# Patient Record
Sex: Female | Born: 2000 | Race: Black or African American | Hispanic: No | Marital: Single | Smoking: Never smoker
Health system: Southern US, Community
[De-identification: ages and names within clinical notes are randomized; demographics above are authoritative.]

## PROBLEM LIST (undated history)

## (undated) ENCOUNTER — Emergency Department (HOSPITAL_COMMUNITY): Admission: EM | Disposition: A | Discharge status: Home / Self Care | Payer: Self-pay

---
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
## 2018-03-05 ENCOUNTER — Encounter (HOSPITAL_COMMUNITY): Payer: Self-pay

## 2018-03-05 ENCOUNTER — Emergency Department (HOSPITAL_COMMUNITY)
Admission: EM | Admit: 2018-03-05 | Discharge: 2018-03-05 | Disposition: A | Discharge status: Home / Self Care | Payer: Self-pay | Attending: Emergency Medicine | Admitting: Emergency Medicine

## 2018-03-05 ENCOUNTER — Other Ambulatory Visit: Payer: Self-pay

## 2018-03-05 ENCOUNTER — Emergency Department (HOSPITAL_COMMUNITY): Payer: Self-pay

## 2018-03-05 DIAGNOSIS — R519 Headache, unspecified: Secondary | ICD-10-CM

## 2018-03-05 DIAGNOSIS — R51 Headache: Secondary | ICD-10-CM | POA: Insufficient documentation

## 2018-03-05 DIAGNOSIS — Z79899 Other long term (current) drug therapy: Secondary | ICD-10-CM | POA: Insufficient documentation

## 2018-03-05 MED ORDER — IBUPROFEN 400 MG PO TABS
400.0000 mg | ORAL_TABLET | Freq: Once | ORAL | Status: AC
  Administered 2018-03-05: 400 mg via ORAL
  Filled 2018-03-05: qty 1

## 2018-03-05 MED ORDER — ACETAMINOPHEN 325 MG PO TABS
650.0000 mg | ORAL_TABLET | Freq: Once | ORAL | Status: AC
  Administered 2018-03-05: 650 mg via ORAL
  Filled 2018-03-05: qty 2

## 2018-03-05 NOTE — ED Triage Notes (Signed)
Pt got a headache during the night that resulted in lightheadedness and nausea. Vomit x1. Chest started hurting afterwards. Middle chest 6/10. Has not taken any meds for this.

## 2018-03-05 NOTE — Discharge Instructions (Addendum)
Your exam today is normal.  Follow-up with your primary doctor.  Return to the ED if you develop new or worsening symptoms.

## 2018-03-05 NOTE — ED Provider Notes (Signed)
[facility] EMERGENCY DEPARTMENT Provider Note   CSN: [id number] Arrival date & time: 03/05/18  0002     History   Chief Complaint Chief Complaint  Patient presents with  . Headache    HPI Marissa Hicks is a 17 y.o. female.  Patient with gradual onset headache onset around 11 PM associated with lightheadedness and one episode of nausea and vomiting.  Patient reports no history of migraine headaches or headaches in general.  She denies thunderclap onset.  No fevers.  No focal weakness, numbness, tingling.  No difficulty with speaking or swallowing.  Patient states she threw up once with a headache and since then she had some tightness in her chest and some shortness of breath.  This lasted for about an hour has since resolved.  She has no chest pain or shortness of breath currently.  She does have some epigastric pain.  She reports no medical conditions and does not take any medicines.  Denies any chance of pregnancy.  Denies any photophobia or phonophobia.  The history is provided by the patient.  Headache   Associated symptoms include shortness of breath, nausea and vomiting.    History reviewed. No pertinent past medical history.  Patient Active Problem List   Diagnosis Date Noted  . Activities involving running 03/02/2014  . Pain in joint, lower leg 03/02/2014  . Muscle weakness (generalized) 03/02/2014    History reviewed. No pertinent surgical history.   OB History    Gravida  0   Para  0   Term  0   Preterm  0   AB  0   Living  0     SAB  0   TAB  0   Ectopic  0   Multiple  0   Live Births  0            Home Medications    Prior to Admission medications   Medication Sig Start Date End Date Taking? Authorizing Provider  etonogestrel (NEXPLANON) 68 MG IMPL implant 1 each by Subdermal route once.    [provider]    Family History Family History  Problem Relation Age of Onset  . Diabetes Maternal Grandmother   .  Hypertension Maternal Grandmother   . Hypertension Maternal Grandfather     Social History Social History   Tobacco Use  . Smoking status: Never Smoker  . Smokeless tobacco: Never Used  Substance Use Topics  . Alcohol use: Yes    Comment: occ  . Drug use: No     Allergies   Patient has no known allergies.   Review of Systems Review of Systems  Constitutional: Negative for activity change and appetite change.  Eyes: Negative for photophobia and visual disturbance.  Respiratory: Positive for shortness of breath. Negative for cough, chest tightness and wheezing.   Cardiovascular: Positive for chest pain.  Gastrointestinal: Positive for abdominal pain, nausea and vomiting.  Genitourinary: Negative for dysuria, vaginal bleeding and vaginal discharge.  Musculoskeletal: Negative for arthralgias, back pain, myalgias and neck stiffness.  Neurological: Positive for headaches. Negative for dizziness, weakness, light-headedness and numbness.   all other systems are negative except as noted in the HPI and PMH.     Physical Exam Updated Vital Signs BP 128/68   Pulse 95   Temp 98.7 F (37.1 C) (Oral)   Resp 18   Ht 5\' 5"  (1.651 m)   Wt 56.7 kg   SpO2 100%   BMI 20.80 kg/m   Physical  Exam  Constitutional: She is oriented to person, place, and time. She appears well-developed and well-nourished. No distress.  Well appearing, smiling  HENT:  Head: Normocephalic and atraumatic.  Mouth/Throat: Oropharynx is clear and moist. No oropharyngeal exudate.  Eyes: Pupils are equal, round, and reactive to light. Conjunctivae and EOM are normal.  Neck: Normal range of motion. Neck supple.  No meningismus.  Cardiovascular: Normal rate, regular rhythm, normal heart sounds and intact distal pulses.  No murmur heard. Pulmonary/Chest: Effort normal and breath sounds normal. No respiratory distress.  Abdominal: Soft. There is tenderness. There is no rebound and no guarding.  Mild epigastric  tenderness  Musculoskeletal: Normal range of motion. She exhibits no edema or tenderness.  Neurological: She is alert and oriented to person, place, and time. No cranial nerve deficit. She exhibits normal muscle tone. Coordination normal.  CN 2-12 intact, no ataxia on finger to nose, no nystagmus, 5/5 strength throughout, no pronator drift, Romberg negative, normal gait.   Skin: Skin is warm.  Psychiatric: She has a normal mood and affect. Her behavior is normal.  Nursing note and vitals reviewed.    ED Treatments / Results  Labs (all labs ordered are listed, but only abnormal results are displayed) Labs Reviewed - No data to display  EKG EKG Interpretation  Date/Time:  Thursday March 05 2018 01:46:09 EDT Ventricular Rate:  82 PR Interval:    QRS Duration: 74 QT Interval:  364 QTC Calculation: 426 R Axis:   72 Text Interpretation:  Sinus rhythm Borderline T wave abnormalities No previous ECGs available Confirmed by Glynn Octave [phone]) on 03/05/2018 1:50:06 AM   Radiology Dg Chest 2 View  Result Date: 03/05/2018 CLINICAL DATA:  Chest pain and dyspnea EXAM: CHEST - 2 VIEW COMPARISON:  02/24/2007 chest radiograph. FINDINGS: Stable cardiomediastinal silhouette with normal heart size. No pneumothorax. No pleural effusion. Lungs appear clear, with no acute consolidative airspace disease and no pulmonary edema. IMPRESSION: No active cardiopulmonary disease. Electronically Signed   By: Delbert Phenix M.D.   On: 03/05/2018 01:46    Procedures Procedures (including critical care time)  Medications Ordered in ED Medications  ibuprofen (ADVIL,MOTRIN) tablet 400 mg (has no administration in time range)  acetaminophen (TYLENOL) tablet 650 mg (has no administration in time range)     Initial Impression / Assessment and Plan / ED Course  I have reviewed the triage vital signs and the nursing notes.  Pertinent labs & imaging results that were available during my care of the  patient were reviewed by me and considered in my medical decision making (see chart for details).    Gradual onset headache with one episode of nausea and vomiting.  Episode of chest pain or shortness of breath which is since resolved.  Nonfocal neurological exam.  Low suspicion for subarachnoid hemorrhage, meningitis, temporal arteritis. Patient declines IV or IM medications.  Patient reports resolution of headache with Tylenol and ibuprofen.  Chest x-ray is negative.  EKG is reassuring.  Low suspicion for ACS, pulmonary embolism, or dissection.  Patient appears very well.  Her headache is not thunderclap onset and her neurological exam is normal.  Low suspicion for subarachnoid hemorrhage or meningitis. No indication for emergent CT scan.   She appears stable for follow-up with her PCP and/or neurology.  Return precautions discussed. Final Clinical Impressions(s) / ED Diagnoses   Final diagnoses:  Headache, unspecified headache type    ED Discharge Orders    None       Errin Chewning, Jeannett Senior,  MD 03/05/18 4098

## 2018-08-27 ENCOUNTER — Other Ambulatory Visit (HOSPITAL_COMMUNITY): Payer: Self-pay | Admitting: Nurse Practitioner

## 2018-08-27 DIAGNOSIS — R229 Localized swelling, mass and lump, unspecified: Principal | ICD-10-CM

## 2018-08-27 DIAGNOSIS — IMO0002 Reserved for concepts with insufficient information to code with codable children: Secondary | ICD-10-CM

## 2018-09-15 ENCOUNTER — Ambulatory Visit (HOSPITAL_COMMUNITY)
Admission: RE | Admit: 2018-09-15 | Discharge: 2018-09-15 | Disposition: A | Discharge status: Home / Self Care | Payer: Self-pay | Source: Ambulatory Visit | Attending: Nurse Practitioner | Admitting: Nurse Practitioner

## 2018-09-15 ENCOUNTER — Other Ambulatory Visit: Payer: Self-pay

## 2018-09-15 DIAGNOSIS — R229 Localized swelling, mass and lump, unspecified: Principal | ICD-10-CM

## 2018-09-15 DIAGNOSIS — IMO0002 Reserved for concepts with insufficient information to code with codable children: Secondary | ICD-10-CM

## 2019-01-07 ENCOUNTER — Other Ambulatory Visit (HOSPITAL_COMMUNITY): Payer: Self-pay | Admitting: Nurse Practitioner

## 2019-01-07 DIAGNOSIS — N63 Unspecified lump in unspecified breast: Secondary | ICD-10-CM

## 2019-01-12 ENCOUNTER — Ambulatory Visit (HOSPITAL_COMMUNITY)
Admission: RE | Admit: 2019-01-12 | Discharge: 2019-01-12 | Disposition: A | Discharge status: Home / Self Care | Payer: Self-pay | Source: Ambulatory Visit | Attending: Nurse Practitioner | Admitting: Nurse Practitioner

## 2019-01-12 ENCOUNTER — Other Ambulatory Visit: Payer: Self-pay

## 2019-01-12 DIAGNOSIS — N63 Unspecified lump in unspecified breast: Secondary | ICD-10-CM | POA: Insufficient documentation

## 2019-03-12 ENCOUNTER — Other Ambulatory Visit: Payer: Self-pay

## 2019-03-12 DIAGNOSIS — Z20822 Contact with and (suspected) exposure to covid-19: Secondary | ICD-10-CM

## 2019-03-13 LAB — NOVEL CORONAVIRUS, NAA: SARS-CoV-2, NAA: NOT DETECTED

## 2019-06-24 ENCOUNTER — Other Ambulatory Visit: Payer: PRIVATE HEALTH INSURANCE

## 2019-07-07 ENCOUNTER — Encounter: Payer: Self-pay | Admitting: Family Medicine

## 2019-07-08 ENCOUNTER — Encounter: Payer: Self-pay | Admitting: Family Medicine

## 2019-12-09 ENCOUNTER — Other Ambulatory Visit: Payer: Self-pay

## 2019-12-09 ENCOUNTER — Telehealth: Payer: Self-pay

## 2019-12-09 DIAGNOSIS — O3680X0 Pregnancy with inconclusive fetal viability, not applicable or unspecified: Secondary | ICD-10-CM

## 2019-12-09 NOTE — Progress Notes (Unsigned)
Called Radiology U/S to schedule less than 14 week U/S , states that the number is not in service. Was told system down, will have to keep trying to call back, not sure when it will be back up.

## 2019-12-09 NOTE — Telephone Encounter (Signed)
Marissa Hicks from [facility] called Nurse line on 12/08/19 after 3p, stating that this Pt Tested + for UPT. She had an Abdominal & Transvaginal US & they found no gestational sack. Per Marylynn Pearson RN, Pt. Will need a OB COMPLETE LESS THAN 14 WEEKS & Transvaginal.

## 2019-12-15 ENCOUNTER — Telehealth: Payer: Self-pay

## 2019-12-15 ENCOUNTER — Other Ambulatory Visit: Payer: Self-pay

## 2019-12-15 DIAGNOSIS — O3680X1 Pregnancy with inconclusive fetal viability, fetus 1: Secondary | ICD-10-CM

## 2019-12-15 NOTE — Telephone Encounter (Signed)
Called pt to advise that she has U/S at [facility] ON 12/20/19 @8AM , Please arrive @ 7:45am with a full bladder, no answer, had to leave a VM.

## 2019-12-20 ENCOUNTER — Ambulatory Visit: Admission: RE | Admit: 2019-12-20 | Payer: PRIVATE HEALTH INSURANCE | Source: Ambulatory Visit

## 2020-02-15 ENCOUNTER — Encounter (HOSPITAL_COMMUNITY): Payer: Self-pay

## 2020-02-15 ENCOUNTER — Emergency Department (HOSPITAL_COMMUNITY)
Admission: EM | Admit: 2020-02-15 | Discharge: 2020-02-15 | Disposition: A | Discharge status: Home / Self Care | Payer: PRIVATE HEALTH INSURANCE | Attending: Emergency Medicine | Admitting: Emergency Medicine

## 2020-02-15 ENCOUNTER — Other Ambulatory Visit: Payer: Self-pay

## 2020-02-15 DIAGNOSIS — R1030 Lower abdominal pain, unspecified: Secondary | ICD-10-CM | POA: Insufficient documentation

## 2020-02-15 DIAGNOSIS — R1010 Upper abdominal pain, unspecified: Secondary | ICD-10-CM

## 2020-02-15 DIAGNOSIS — R197 Diarrhea, unspecified: Secondary | ICD-10-CM | POA: Insufficient documentation

## 2020-02-15 DIAGNOSIS — Z5321 Procedure and treatment not carried out due to patient leaving prior to being seen by health care provider: Secondary | ICD-10-CM | POA: Insufficient documentation

## 2020-02-15 LAB — COMPREHENSIVE METABOLIC PANEL
ALT: 20 U/L (ref 0–44)
AST: 21 U/L (ref 15–41)
Albumin: 4.6 g/dL (ref 3.5–5.0)
Alkaline Phosphatase: 80 U/L (ref 38–126)
Anion gap: 10 (ref 5–15)
BUN: 10 mg/dL (ref 6–20)
CO2: 25 mmol/L (ref 22–32)
Calcium: 9.5 mg/dL (ref 8.9–10.3)
Chloride: 101 mmol/L (ref 98–111)
Creatinine, Ser: 0.69 mg/dL (ref 0.44–1.00)
GFR calc Af Amer: >60 mL/min (ref >60)
GFR calc non Af Amer: >60 mL/min (ref >60)
Glucose, Bld: 125 mg/dL — ABNORMAL HIGH (ref 70–99)
Potassium: 3.4 mmol/L — ABNORMAL LOW (ref 3.5–5.1)
Sodium: 136 mmol/L (ref 135–145)
Total Bilirubin: 1 mg/dL (ref 0.3–1.2)
Total Protein: 8.2 g/dL — ABNORMAL HIGH (ref 6.5–8.1)

## 2020-02-15 LAB — LIPASE, BLOOD: Lipase: 25 U/L (ref 11–51)

## 2020-02-15 LAB — URINALYSIS, ROUTINE W REFLEX MICROSCOPIC
Bilirubin Urine: NEGATIVE
Glucose, UA: NEGATIVE mg/dL
Hgb urine dipstick: NEGATIVE
Ketones, ur: NEGATIVE mg/dL
Leukocytes,Ua: NEGATIVE
Nitrite: NEGATIVE
Protein, ur: NEGATIVE mg/dL
Specific Gravity, Urine: 1.009 (ref 1.005–1.030)
pH: 7 (ref 5.0–8.0)

## 2020-02-15 LAB — CBC
HCT: 42 % (ref 36.0–46.0)
Hemoglobin: 13.2 g/dL (ref 12.0–15.0)
MCH: 27.1 pg (ref 26.0–34.0)
MCHC: 31.4 g/dL (ref 30.0–36.0)
MCV: 86.2 fL (ref 80.0–100.0)
Platelets: 377 10*3/uL (ref 150–400)
RBC: 4.87 MIL/uL (ref 3.87–5.11)
RDW: 14.6 % (ref 11.5–15.5)
WBC: 7.3 10*3/uL (ref 4.0–10.5)
nRBC: 0 % (ref 0.0–0.2)

## 2020-02-15 LAB — POC URINE PREG, ED: Preg Test, Ur: NEGATIVE

## 2020-02-15 NOTE — ED Triage Notes (Signed)
Pt presents to ED with complaints of lower abdominal cramping and diarrhea started last night.

## 2020-07-19 ENCOUNTER — Ambulatory Visit
Admission: EM | Admit: 2020-07-19 | Discharge: 2020-07-19 | Disposition: A | Discharge status: Home / Self Care | Payer: PRIVATE HEALTH INSURANCE | Attending: Family Medicine | Admitting: Family Medicine

## 2020-07-19 DIAGNOSIS — Z3202 Encounter for pregnancy test, result negative: Secondary | ICD-10-CM | POA: Diagnosis not present

## 2020-07-19 DIAGNOSIS — Z113 Encounter for screening for infections with a predominantly sexual mode of transmission: Secondary | ICD-10-CM | POA: Insufficient documentation

## 2020-07-19 DIAGNOSIS — N898 Other specified noninflammatory disorders of vagina: Secondary | ICD-10-CM | POA: Insufficient documentation

## 2020-07-19 LAB — POCT URINE PREGNANCY: Preg Test, Ur: NEGATIVE

## 2020-07-19 NOTE — ED Triage Notes (Signed)
Pt wants std screening after recent sexual encounter where she had some bloody discharge, no other symptoms

## 2020-07-19 NOTE — Discharge Instructions (Signed)
Blood work will be back overnight tonight   Your vaginal tests are pending.  If your test results are positive, we will call you.  You and your sexual partner(s) may require treatment at that time.  Do not have sexual activity until the test results are back.  Follow up with this office or with primary care if symptoms are persisting.  Follow up in the ER for high fever, trouble swallowing, trouble breathing, other concerning symptoms.

## 2020-07-19 NOTE — ED Provider Notes (Signed)
[facility] CARE CENTER   [id number] 07/19/20 Arrival Time: 1012   CC: CONCERN FOR STD  SUBJECTIVE:  Marissa Hicks is a 20 y.o. female who presents requesting STI screening.  Currently asymptomatic. Partner asymptomatic. Last unprotected sexual encounter was yesterday. Reports minimal bloody discharge after sex. Denies similar symptoms in the past.    Denies fever, chills, nausea, vomiting, abdominal or pelvic pain, urinary symptoms, vaginal itching, vaginal odor, vaginal bleeding, dyspareunia, vaginal rashes or lesions.   ROS: As per HPI.  All other pertinent ROS negative.     No past medical history on file. No past surgical history on file. No Known Allergies No current facility-administered medications on file prior to encounter.   Current Outpatient Medications on File Prior to Encounter  Medication Sig Dispense Refill  . etonogestrel (NEXPLANON) 68 MG IMPL implant 1 each by Subdermal route once.     Social History   Socioeconomic History  . Marital status: Single    Spouse name: Not on file  . Number of children: Not on file  . Years of education: Not on file  . Highest education level: Not on file  Occupational History  . Not on file  Tobacco Use  . Smoking status: Never Smoker  . Smokeless tobacco: Never Used  Substance and Sexual Activity  . Alcohol use: Yes    Comment: occ  . Drug use: No  . Sexual activity: Yes    Birth control/protection: Implant  Other Topics Concern  . Not on file  Social History Narrative  . Not on file   Social Determinants of Health   Financial Resource Strain: Not on file  Food Insecurity: Not on file  Transportation Needs: Not on file  Physical Activity: Not on file  Stress: Not on file  Social Connections: Not on file  Intimate Partner Violence: Not on file   Family History  Problem Relation Age of Onset  . Diabetes Maternal Grandmother   . Hypertension Maternal Grandmother   . Hypertension Maternal Grandfather      OBJECTIVE:  Vitals:   07/19/20 1034  BP: 111/71  Pulse: 90  Resp: 16  Temp: 97.9 F (36.6 C)  TempSrc: Tympanic  SpO2: 99%     General appearance: alert, NAD, appears stated age Head: NCAT Throat: lips, mucosa, and tongue normal; teeth and gums normal Lungs: CTA bilaterally without adventitious breath sounds Heart: regular rate and rhythm.  Radial pulses 2+ symmetrical bilaterally Back: no CVA tenderness Abdomen: soft, non-tender; bowel sounds normal; no masses or organomegaly; no guarding or rebound tenderness GU: deferred Skin: warm and dry Psychological:  Alert and cooperative. Normal mood and affect.  LABS:  Results for orders placed or performed during the hospital encounter of 07/19/20  HIV Antibody (routine testing w rflx)  Result Value Ref Range   HIV Screen 4th Generation wRfx Non Reactive Non Reactive  RPR  Result Value Ref Range   RPR Ser Ql Non Reactive Non Reactive  POCT urine pregnancy  Result Value Ref Range   Preg Test, Ur Negative Negative  Cervicovaginal ancillary only  Result Value Ref Range   Bacterial Vaginitis (gardnerella) Negative    Candida Vaginitis Negative    Candida Glabrata Negative    Trichomonas Negative    Chlamydia Negative    Neisseria Gonorrhea Negative    Comment      Normal Reference Range Bacterial Vaginosis - Negative   Comment Normal Reference Range Candida Species - Negative    Comment Normal Reference Range Candida  Galbrata - Negative    Comment Normal Reference Range Trichomonas - Negative    Comment Normal Reference Ranger Chlamydia - Negative    Comment      Normal Reference Range Neisseria Gonorrhea - Negative    Labs Reviewed  HIV ANTIBODY (ROUTINE TESTING W REFLEX)   Narrative:    Performed at:  [address] [facility] [address] [place], [place]  [id number] Lab Director: Jolene Schimke MD, Phone:  [phone]  RPR   Narrative:    Performed at:  [address] [place] [address], [place],  [place]  [id number] Lab Director: Jolene Schimke MD, Phone:  [phone]  POCT URINE PREGNANCY  CERVICOVAGINAL ANCILLARY ONLY    ASSESSMENT & PLAN:  1. Screen for STD (sexually transmitted disease)   2. Vaginal discharge   3. Negative pregnancy test     No orders of the defined types were placed in this encounter.   Pending: Labs Reviewed  HIV ANTIBODY (ROUTINE TESTING W REFLEX)   Narrative:    Performed at:  [facility] [address] [facility] [address] [place], [place]  [id number] Lab Director: Jolene Schimke MD, Phone:  [phone]  RPR   Narrative:    Performed at:  [address] [place] [address], [place], [place]  [id number] Lab Director: Jolene Schimke MD, Phone:  [phone]  POCT URINE PREGNANCY  CERVICOVAGINAL ANCILLARY ONLY   Negative UA in office today Negative pregnancy test in office today Vaginal self swab or Urine cytology obtained  HIV/ syphilis testing today Take medications as prescribed and to completion We will follow up with you regarding the results of your test If tests are positive, please abstain from sexual activity until you and your partner(s) are treated Follow up with PCP or [facility] if symptoms persists Return here or go to ER if you have any new or worsening symptoms    Reviewed expectations re: course of current medical issues. Questions answered. Outlined signs and symptoms indicating need for more acute intervention. Patient verbalized understanding. After Visit Summary given.       Moshe Cipro, NP 07/20/20 1951

## 2020-07-20 LAB — CERVICOVAGINAL ANCILLARY ONLY
Bacterial Vaginitis (gardnerella): NEGATIVE
Candida Glabrata: NEGATIVE
Candida Vaginitis: NEGATIVE
Chlamydia: NEGATIVE
Comment: NEGATIVE
Comment: NEGATIVE
Comment: NEGATIVE
Comment: NEGATIVE
Comment: NEGATIVE
Comment: NORMAL
Neisseria Gonorrhea: NEGATIVE
Trichomonas: NEGATIVE

## 2020-07-20 LAB — RPR: RPR Ser Ql: NONREACTIVE

## 2020-07-20 LAB — HIV ANTIBODY (ROUTINE TESTING W REFLEX): HIV Screen 4th Generation wRfx: NONREACTIVE

## 2020-11-13 ENCOUNTER — Other Ambulatory Visit: Payer: Self-pay

## 2020-11-13 ENCOUNTER — Encounter (HOSPITAL_COMMUNITY): Payer: Self-pay

## 2020-11-13 ENCOUNTER — Inpatient Hospital Stay (EMERGENCY_DEPARTMENT_HOSPITAL)
Admission: AD | Admit: 2020-11-13 | Discharge: 2020-11-13 | Disposition: A | Discharge status: Home / Self Care | Payer: PRIVATE HEALTH INSURANCE | Source: Home / Self Care | Attending: Family Medicine | Admitting: Family Medicine

## 2020-11-13 ENCOUNTER — Emergency Department (HOSPITAL_COMMUNITY)
Admission: EM | Admit: 2020-11-13 | Discharge: 2020-11-13 | Disposition: A | Discharge status: Home / Self Care | Payer: PRIVATE HEALTH INSURANCE | Attending: Emergency Medicine | Admitting: Emergency Medicine

## 2020-11-13 DIAGNOSIS — N912 Amenorrhea, unspecified: Secondary | ICD-10-CM | POA: Insufficient documentation

## 2020-11-13 DIAGNOSIS — R102 Pelvic and perineal pain: Secondary | ICD-10-CM | POA: Diagnosis present

## 2020-11-13 DIAGNOSIS — Z5321 Procedure and treatment not carried out due to patient leaving prior to being seen by health care provider: Secondary | ICD-10-CM | POA: Insufficient documentation

## 2020-11-13 LAB — HCG, QUANTITATIVE, PREGNANCY: hCG, Beta Chain, Quant, S: <1 m[IU]/mL (ref <5)

## 2020-11-13 NOTE — ED Triage Notes (Signed)
Pt pov from home with cc of missed period. Last cycle was april 24th. Has been having lower pelvic pain. Has had negative urine tests but was a blood test to confirm.  Has had intercourse. Has not been on [facility] in 2 years.

## 2020-11-13 NOTE — Discharge Instructions (Signed)
[facility] Prenatal Care Providers          [facility] locations:  Hours may vary. Please call for an appointment  [facility] @ [facility]  [address] [phone]  [facility] @ [facility]   [address]  [phone]  [facility] @ [place]       [address] [phone]            [facility] @ [place]     [address] [phone]          [facility] @ [place]   [address] [phone]  [facility] @ [facility]  [address] [phone]     [facility] @ [facility]  [address]   [phone]  

## 2020-11-13 NOTE — MAU Provider Note (Signed)
S Ms. HATTYE SIEGFRIED is a 20 y.o. G0P0000 female who presents to MAU today with complaint of amenorrhea and negative home UPT. Denies pain or VB.   ROS: No pain No VB  O BP 136/76 (BP Location: Right Arm)   Pulse (!) 106   Temp 99.2 F (37.3 C) (Oral)   Resp 20   Ht 5\' 5"  (1.651 m)   Wt 72.5 kg   LMP 10/01/2020   BMI 26.59 kg/m  Physical Exam Vitals and nursing note reviewed.  Constitutional:      Appearance: Normal appearance.  HENT:     Head: Normocephalic and atraumatic.  Pulmonary:     Effort: Pulmonary effort is normal. No respiratory distress.  Musculoskeletal:     Cervical back: Normal range of motion.  Neurological:     General: No focal deficit present.     Mental Status: She is alert and oriented to person, place, and time.  Psychiatric:        Mood and Affect: Mood normal.        Behavior: Behavior normal.    MDM: No indication for UPT in MAU but UPT done by tech and negative per RN, pt informed. Stable for discharge home.   A 1. Amenorrhea    P Discharge from MAU in stable condition Warning signs for worsening condition that would warrant emergency follow-up discussed Patient may return to MAU as needed for pregnancy related complaints List of [facility] offices provided  [facility], Donette Larry 11/13/2020 7:48 PM

## 2020-11-13 NOTE — MAU Note (Signed)
PT SAYS NO CYCLE IN MAY. PT SAYS UPT IS NEG AT HOME - LAST Friday NO PAIN, NO BLEEDING.

## 2021-01-09 ENCOUNTER — Other Ambulatory Visit: Payer: Self-pay

## 2021-01-09 ENCOUNTER — Encounter (HOSPITAL_COMMUNITY): Payer: Self-pay | Admitting: *Deleted

## 2021-01-09 DIAGNOSIS — Z5321 Procedure and treatment not carried out due to patient leaving prior to being seen by health care provider: Secondary | ICD-10-CM | POA: Insufficient documentation

## 2021-01-09 DIAGNOSIS — N939 Abnormal uterine and vaginal bleeding, unspecified: Secondary | ICD-10-CM | POA: Insufficient documentation

## 2021-01-09 LAB — CBC WITH DIFFERENTIAL/PLATELET
Abs Immature Granulocytes: 0.02 10*3/uL (ref 0.00–0.07)
Basophils Absolute: 0.1 10*3/uL (ref 0.0–0.1)
Basophils Relative: 1 %
Eosinophils Absolute: 0.4 10*3/uL (ref 0.0–0.5)
Eosinophils Relative: 6 %
HCT: 37.7 % (ref 36.0–46.0)
Hemoglobin: 11.9 g/dL — ABNORMAL LOW (ref 12.0–15.0)
Immature Granulocytes: 0 %
Lymphocytes Relative: 29 %
Lymphs Abs: 1.9 10*3/uL (ref 0.7–4.0)
MCH: 27.3 pg (ref 26.0–34.0)
MCHC: 31.6 g/dL (ref 30.0–36.0)
MCV: 86.5 fL (ref 80.0–100.0)
Monocytes Absolute: 0.7 10*3/uL (ref 0.1–1.0)
Monocytes Relative: 10 %
Neutro Abs: 3.4 10*3/uL (ref 1.7–7.7)
Neutrophils Relative %: 54 %
Platelets: 361 10*3/uL (ref 150–400)
RBC: 4.36 MIL/uL (ref 3.87–5.11)
RDW: 15.2 % (ref 11.5–15.5)
WBC: 6.5 10*3/uL (ref 4.0–10.5)
nRBC: 0 % (ref 0.0–0.2)

## 2021-01-09 LAB — BASIC METABOLIC PANEL
Anion gap: 4 — ABNORMAL LOW (ref 5–15)
BUN: 11 mg/dL (ref 6–20)
CO2: 26 mmol/L (ref 22–32)
Calcium: 8.6 mg/dL — ABNORMAL LOW (ref 8.9–10.3)
Chloride: 106 mmol/L (ref 98–111)
Creatinine, Ser: 0.7 mg/dL (ref 0.44–1.00)
GFR, Estimated: >60 mL/min (ref >60)
Glucose, Bld: 84 mg/dL (ref 70–99)
Potassium: 3.3 mmol/L — ABNORMAL LOW (ref 3.5–5.1)
Sodium: 136 mmol/L (ref 135–145)

## 2021-01-09 LAB — HCG, QUANTITATIVE, PREGNANCY: hCG, Beta Chain, Quant, S: <1 m[IU]/mL (ref <5)

## 2021-01-09 NOTE — ED Triage Notes (Signed)
Vaginal bleeding for 2 days, states it is her second in a month. States bleeding is heavy

## 2021-01-10 ENCOUNTER — Emergency Department (HOSPITAL_COMMUNITY)
Admission: EM | Admit: 2021-01-10 | Discharge: 2021-01-10 | Disposition: A | Discharge status: Home / Self Care | Payer: PRIVATE HEALTH INSURANCE | Attending: Physician Assistant | Admitting: Physician Assistant

## 2021-02-09 ENCOUNTER — Other Ambulatory Visit: Payer: Self-pay

## 2021-02-09 ENCOUNTER — Inpatient Hospital Stay (HOSPITAL_COMMUNITY)
Admission: AD | Admit: 2021-02-09 | Discharge: 2021-02-09 | Disposition: A | Discharge status: Home / Self Care | Payer: PRIVATE HEALTH INSURANCE | Attending: Obstetrics & Gynecology | Admitting: Obstetrics & Gynecology

## 2021-02-09 DIAGNOSIS — Z3202 Encounter for pregnancy test, result negative: Secondary | ICD-10-CM

## 2021-02-09 DIAGNOSIS — N941 Unspecified dyspareunia: Secondary | ICD-10-CM

## 2021-02-09 LAB — POCT PREGNANCY, URINE: Preg Test, Ur: NEGATIVE

## 2021-02-09 NOTE — MAU Note (Signed)
Marissa Hicks is a 20 y.o. here in MAU reporting: lower abdominal pain since yesterday, states then she was having jabbing pain in her lower abdomen while having intercourse. No bleeding or discharge. Was unsure if pregnant.  LMP: unknown, states they are irregular  Onset of complaint: yesterday  Pain score: 7/10  Vitals:   02/09/21 1309  BP: 135/73  Pulse: 77  Resp: 16  Temp: 98.1 F (36.7 C)  SpO2: 98%     Lab orders placed from triage: upt

## 2021-02-09 NOTE — MAU Provider Note (Signed)
Event Date/Time   First Provider Initiated Contact with Patient 02/09/21 1335      S Ms. Marissa Hicks is a 20 y.o. G0P0000 patient who presents to MAU today with complaint of abdominal pain. She is not pregnant.   O BP 135/73 (BP Location: Right Arm)   Pulse 77   Temp 98.1 F (36.7 C) (Oral)   Resp 16   Ht 5\' 5"  (1.651 m)   Wt 75.3 kg   LMP  (LMP Unknown)   SpO2 98% Comment: room air  BMI 27.61 kg/m  Physical Exam Constitutional:      General: She is not in acute distress.    Appearance: She is well-developed. She is not ill-appearing.  Neurological:     Mental Status: She is alert and oriented to person, place, and time.    A Medical screening exam complete None pregnant female   P Discharge from MAU in stable condition Patient given the option of transfer to [facility] for further evaluation or seek care in outpatient facility of choice; patient would like to f/u in our GYN office. Information given.  List of options for follow-up given  Warning signs for worsening condition that would warrant emergency follow-up discussed Patient may return to MAU as needed   Jaidan Prevette, [facility], NP 02/09/2021 1:35 PM

## 2021-02-16 ENCOUNTER — Encounter (HOSPITAL_COMMUNITY): Payer: Self-pay | Admitting: Emergency Medicine

## 2021-02-16 ENCOUNTER — Emergency Department (HOSPITAL_COMMUNITY): Payer: Self-pay

## 2021-02-16 ENCOUNTER — Other Ambulatory Visit: Payer: Self-pay

## 2021-02-16 ENCOUNTER — Emergency Department (HOSPITAL_COMMUNITY)
Admission: EM | Admit: 2021-02-16 | Discharge: 2021-02-17 | Disposition: A | Discharge status: Home / Self Care | Payer: Self-pay | Attending: Physician Assistant | Admitting: Physician Assistant

## 2021-02-16 DIAGNOSIS — Z5321 Procedure and treatment not carried out due to patient leaving prior to being seen by health care provider: Secondary | ICD-10-CM | POA: Insufficient documentation

## 2021-02-16 DIAGNOSIS — S01111A Laceration without foreign body of right eyelid and periocular area, initial encounter: Secondary | ICD-10-CM | POA: Insufficient documentation

## 2021-02-16 DIAGNOSIS — Y9289 Other specified places as the place of occurrence of the external cause: Secondary | ICD-10-CM | POA: Insufficient documentation

## 2021-02-16 DIAGNOSIS — M79631 Pain in right forearm: Secondary | ICD-10-CM | POA: Insufficient documentation

## 2021-02-16 DIAGNOSIS — Y908 Blood alcohol level of 240 mg/100 ml or more: Secondary | ICD-10-CM | POA: Insufficient documentation

## 2021-02-16 DIAGNOSIS — F10929 Alcohol use, unspecified with intoxication, unspecified: Secondary | ICD-10-CM | POA: Insufficient documentation

## 2021-02-16 DIAGNOSIS — R519 Headache, unspecified: Secondary | ICD-10-CM | POA: Insufficient documentation

## 2021-02-16 LAB — I-STAT CHEM 8, ED
BUN: 6 mg/dL (ref 6–20)
Calcium, Ion: 1.11 mmol/L — ABNORMAL LOW (ref 1.15–1.40)
Chloride: 114 mmol/L — ABNORMAL HIGH (ref 98–111)
Creatinine, Ser: 1.1 mg/dL — ABNORMAL HIGH (ref 0.44–1.00)
Glucose, Bld: 116 mg/dL — ABNORMAL HIGH (ref 70–99)
HCT: 44 % (ref 36.0–46.0)
Hemoglobin: 15 g/dL (ref 12.0–15.0)
Potassium: 3.3 mmol/L — ABNORMAL LOW (ref 3.5–5.1)
Sodium: 146 mmol/L — ABNORMAL HIGH (ref 135–145)
TCO2: 21 mmol/L — ABNORMAL LOW (ref 22–32)

## 2021-02-16 LAB — COMPREHENSIVE METABOLIC PANEL
ALT: 20 U/L (ref 0–44)
AST: 21 U/L (ref 15–41)
Albumin: 4.2 g/dL (ref 3.5–5.0)
Alkaline Phosphatase: 74 U/L (ref 38–126)
Anion gap: 10 (ref 5–15)
BUN: 7 mg/dL (ref 6–20)
CO2: 21 mmol/L — ABNORMAL LOW (ref 22–32)
Calcium: 8.9 mg/dL (ref 8.9–10.3)
Chloride: 112 mmol/L — ABNORMAL HIGH (ref 98–111)
Creatinine, Ser: 0.79 mg/dL (ref 0.44–1.00)
GFR, Estimated: >60 mL/min (ref >60)
Glucose, Bld: 108 mg/dL — ABNORMAL HIGH (ref 70–99)
Potassium: 3.2 mmol/L — ABNORMAL LOW (ref 3.5–5.1)
Sodium: 143 mmol/L (ref 135–145)
Total Bilirubin: 0.9 mg/dL (ref 0.3–1.2)
Total Protein: 7.5 g/dL (ref 6.5–8.1)

## 2021-02-16 LAB — CBC WITH DIFFERENTIAL/PLATELET
Abs Immature Granulocytes: 0.04 10*3/uL (ref 0.00–0.07)
Basophils Absolute: 0.1 10*3/uL (ref 0.0–0.1)
Basophils Relative: 1 %
Eosinophils Absolute: 0.3 10*3/uL (ref 0.0–0.5)
Eosinophils Relative: 5 %
HCT: 41.7 % (ref 36.0–46.0)
Hemoglobin: 13.6 g/dL (ref 12.0–15.0)
Immature Granulocytes: 1 %
Lymphocytes Relative: 30 %
Lymphs Abs: 1.8 10*3/uL (ref 0.7–4.0)
MCH: 27.6 pg (ref 26.0–34.0)
MCHC: 32.6 g/dL (ref 30.0–36.0)
MCV: 84.6 fL (ref 80.0–100.0)
Monocytes Absolute: 0.5 10*3/uL (ref 0.1–1.0)
Monocytes Relative: 9 %
Neutro Abs: 3.2 10*3/uL (ref 1.7–7.7)
Neutrophils Relative %: 54 %
Platelets: 371 10*3/uL (ref 150–400)
RBC: 4.93 MIL/uL (ref 3.87–5.11)
RDW: 15.1 % (ref 11.5–15.5)
WBC: 5.9 10*3/uL (ref 4.0–10.5)
nRBC: 0 % (ref 0.0–0.2)

## 2021-02-16 LAB — I-STAT BETA HCG BLOOD, ED (MC, WL, AP ONLY): I-stat hCG, quantitative: <5 m[IU]/mL (ref <5)

## 2021-02-16 LAB — ETHANOL: Alcohol, Ethyl (B): 308 mg/dL (ref <10)
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
## 2021-02-16 NOTE — ED Notes (Signed)
Pt removed IV that was placed by EMS
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
## 2021-02-17 NOTE — ED Notes (Signed)
Pt left AMA °

## 2021-06-07 ENCOUNTER — Encounter (HOSPITAL_COMMUNITY): Payer: Self-pay

## 2021-06-07 ENCOUNTER — Emergency Department (HOSPITAL_COMMUNITY)
Admission: EM | Admit: 2021-06-07 | Discharge: 2021-06-07 | Disposition: A | Discharge status: Home / Self Care | Payer: Self-pay | Attending: Emergency Medicine | Admitting: Emergency Medicine

## 2021-06-07 ENCOUNTER — Other Ambulatory Visit: Payer: Self-pay

## 2021-06-07 ENCOUNTER — Emergency Department (HOSPITAL_COMMUNITY): Payer: Self-pay

## 2021-06-07 DIAGNOSIS — O3481 Maternal care for other abnormalities of pelvic organs, first trimester: Secondary | ICD-10-CM | POA: Insufficient documentation

## 2021-06-07 DIAGNOSIS — Z3A01 Less than 8 weeks gestation of pregnancy: Secondary | ICD-10-CM | POA: Insufficient documentation

## 2021-06-07 DIAGNOSIS — N9489 Other specified conditions associated with female genital organs and menstrual cycle: Secondary | ICD-10-CM | POA: Insufficient documentation

## 2021-06-07 DIAGNOSIS — Z3490 Encounter for supervision of normal pregnancy, unspecified, unspecified trimester: Secondary | ICD-10-CM

## 2021-06-07 DIAGNOSIS — N83201 Unspecified ovarian cyst, right side: Secondary | ICD-10-CM | POA: Insufficient documentation

## 2021-06-07 LAB — COMPREHENSIVE METABOLIC PANEL
ALT: 24 U/L (ref 0–44)
AST: 24 U/L (ref 15–41)
Albumin: 4.6 g/dL (ref 3.5–5.0)
Alkaline Phosphatase: 76 U/L (ref 38–126)
Anion gap: 7 (ref 5–15)
BUN: 12 mg/dL (ref 6–20)
CO2: 26 mmol/L (ref 22–32)
Calcium: 9.1 mg/dL (ref 8.9–10.3)
Chloride: 101 mmol/L (ref 98–111)
Creatinine, Ser: 0.87 mg/dL (ref 0.44–1.00)
GFR, Estimated: >60 mL/min (ref >60)
Glucose, Bld: 93 mg/dL (ref 70–99)
Potassium: 3.2 mmol/L — ABNORMAL LOW (ref 3.5–5.1)
Sodium: 134 mmol/L — ABNORMAL LOW (ref 135–145)
Total Bilirubin: 1 mg/dL (ref 0.3–1.2)
Total Protein: 8.1 g/dL (ref 6.5–8.1)

## 2021-06-07 LAB — URINALYSIS, ROUTINE W REFLEX MICROSCOPIC
Bilirubin Urine: NEGATIVE
Glucose, UA: NEGATIVE mg/dL
Ketones, ur: NEGATIVE mg/dL
Leukocytes,Ua: NEGATIVE
Nitrite: NEGATIVE
Protein, ur: 30 mg/dL — AB
Specific Gravity, Urine: 1.028 (ref 1.005–1.030)
pH: 6 (ref 5.0–8.0)

## 2021-06-07 LAB — CBC
HCT: 42.3 % (ref 36.0–46.0)
Hemoglobin: 13.6 g/dL (ref 12.0–15.0)
MCH: 27.3 pg (ref 26.0–34.0)
MCHC: 32.2 g/dL (ref 30.0–36.0)
MCV: 84.8 fL (ref 80.0–100.0)
Platelets: 372 10*3/uL (ref 150–400)
RBC: 4.99 MIL/uL (ref 3.87–5.11)
RDW: 15.8 % — ABNORMAL HIGH (ref 11.5–15.5)
WBC: 9.9 10*3/uL (ref 4.0–10.5)
nRBC: 0 % (ref 0.0–0.2)

## 2021-06-07 LAB — HCG, QUANTITATIVE, PREGNANCY: hCG, Beta Chain, Quant, S: 5917 m[IU]/mL — ABNORMAL HIGH (ref <5)

## 2021-06-07 LAB — LIPASE, BLOOD: Lipase: 29 U/L (ref 11–51)

## 2021-06-07 LAB — POC URINE PREG, ED: Preg Test, Ur: POSITIVE — AB

## 2021-06-07 LAB — ABO/RH: ABO/RH(D): A POS

## 2021-06-07 MED ORDER — ONDANSETRON HCL 4 MG/2ML IJ SOLN
4.0000 mg | Freq: Once | INTRAMUSCULAR | Status: AC
  Administered 2021-06-07: 13:00:00 4 mg via INTRAVENOUS
  Filled 2021-06-07: qty 2

## 2021-06-07 MED ORDER — LACTATED RINGERS IV BOLUS
1000.0000 mL | Freq: Once | INTRAVENOUS | Status: AC
  Administered 2021-06-07: 12:00:00 1000 mL via INTRAVENOUS

## 2021-06-07 NOTE — ED Provider Notes (Signed)
[facility] EMERGENCY DEPARTMENT Provider Note  CSN: [id number] Arrival date & time: 06/07/21 1018    History Chief Complaint  Patient presents with   Abdominal Pain    Marissa Hicks is a 19 y.o. female with no significant history reports she has had 2 days of lower abdominal cramping, assocaited with nausea, vomiting, diarrhea. She reports some mild vaginal spotting but not like her normal menses and she is concerned she may be pregnant. No fevers no dysuria.    History reviewed. No pertinent past medical history.  History reviewed. No pertinent surgical history.  Family History  Problem Relation Age of Onset   Diabetes Maternal Grandmother    Hypertension Maternal Grandmother    Hypertension Maternal Grandfather     Social History   Tobacco Use   Smoking status: Never   Smokeless tobacco: Never  Substance Use Topics   Alcohol use: Yes    Comment: occ   Drug use: No     Home Medications Prior to Admission medications   Not on File     Allergies    Patient has no known allergies.   Review of Systems   Review of Systems A comprehensive review of systems was completed and negative except as noted in HPI.    Physical Exam BP (!) 112/97    Pulse 95    Temp 99.5 F (37.5 C) (Oral)    Resp 16    Ht 5\' 5"  (1.651 m)    Wt 73.9 kg    LMP 04/23/2021 (Approximate)    SpO2 100%    BMI 27.12 kg/m   Physical Exam Vitals and nursing note reviewed.  Constitutional:      Appearance: Normal appearance.  HENT:     Head: Normocephalic and atraumatic.     Nose: Nose normal.     Mouth/Throat:     Mouth: Mucous membranes are moist.  Eyes:     Extraocular Movements: Extraocular movements intact.     Conjunctiva/sclera: Conjunctivae normal.  Cardiovascular:     Rate and Rhythm: Normal rate.  Pulmonary:     Effort: Pulmonary effort is normal.     Breath sounds: Normal breath sounds.  Abdominal:     General: Abdomen is flat.     Palpations: Abdomen is soft.      Tenderness: There is abdominal tenderness in the right lower quadrant and suprapubic area.     Comments: Exam is limited by patient sitting in a chair in triage, will re-examine when she is in an ED room  Musculoskeletal:        General: No swelling. Normal range of motion.     Cervical back: Neck supple.  Skin:    General: Skin is warm and dry.  Neurological:     General: No focal deficit present.     Mental Status: She is alert.  Psychiatric:        Mood and Affect: Mood normal.     ED Results / Procedures / Treatments   Labs (all labs ordered are listed, but only abnormal results are displayed) Labs Reviewed  COMPREHENSIVE METABOLIC PANEL - Abnormal; Notable for the following components:      Result Value   Sodium 134 (*)    Potassium 3.2 (*)    All other components within normal limits  CBC - Abnormal; Notable for the following components:   RDW 15.8 (*)    All other components within normal limits  URINALYSIS, ROUTINE W REFLEX MICROSCOPIC - Abnormal;  Notable for the following components:   APPearance HAZY (*)    Hgb urine dipstick SMALL (*)    Protein, ur 30 (*)    Bacteria, UA RARE (*)    All other components within normal limits  HCG, QUANTITATIVE, PREGNANCY - Abnormal; Notable for the following components:   hCG, Beta Chain, Quant, S 5,917 (*)    All other components within normal limits  POC URINE PREG, ED - Abnormal; Notable for the following components:   Preg Test, Ur Positive (*)    All other components within normal limits  LIPASE, BLOOD  ABO/RH    EKG None  Radiology US OB LESS THAN 14 WEEKS WITH OB TRANSVAGINAL  Result Date: 06/07/2021 CLINICAL DATA:  Pregnancy with pain for 2 days. EXAM: OBSTETRIC <14 WK [place] AND TRANSVAGINAL OB US TECHNIQUE: Both transabdominal and transvaginal ultrasound examinations were performed for complete evaluation of the gestation as well as the maternal uterus, adnexal regions, and pelvic cul-de-sac. Transvaginal  technique was performed to assess early pregnancy. COMPARISON:  None. FINDINGS: Intrauterine gestational sac: Single Yolk sac:  Not Visualized. Embryo:  Not Visualized. Cardiac Activity: Not Visualized. Heart Rate: Not applicable bpm MSD: 5.2 mm   5 w   2 d Subchorionic hemorrhage:  None visualized. Maternal uterus/adnexae: Right ovary: There is a large anechoic cyst containing a single internal area of septation measuring 6.4 x 6.0 x 7.1 cm. The septation appears thin measuring 3 mm in thickness. No solid mural nodule identified. No significant increased vascularity. Left ovary: Normal Other :None Free fluid:  Trace free fluid IMPRESSION: 1. Probable early intrauterine gestational sac, but no yolk sac, fetal pole, or cardiac activity yet visualized. Recommend follow-up quantitative B-HCG levels and follow-up US in 14 days to confirm and assess viability. This recommendation follows SRU consensus guidelines: Diagnostic Criteria for Nonviable Pregnancy Early in the First Trimester. Malva Limes Med 2013; 595:6387-56. 2. Large anechoic cystic mass arises within the right ovary containing a single thin internal area of septation. Recommend follow-up US in 3-6 months. Note: This recommendation does not apply to premenarchal patients or to those with increased risk (genetic, family history, elevated tumor markers or other high-risk factors) of ovarian cancer. Reference: Radiology 2019 Nov; 293(2):359-371. Electronically Signed   By: Signa Kell M.D.   On: 06/07/2021 12:40    Procedures Procedures  Medications Ordered in the ED Medications  ondansetron (ZOFRAN) injection 4 mg (4 mg Intravenous Given 06/07/21 1231)  lactated ringers bolus 1,000 mL (0 mLs Intravenous Stopped 06/07/21 1313)     MDM Rules/Calculators/A&P MDM Patient with lower abdominal cramping and possible missed period. Will check labs, including HCG and re-evaluate when she is roomed.   ED Course  I have reviewed the triage vital signs and  the nursing notes.  Pertinent labs & imaging results that were available during my care of the patient were reviewed by me and considered in my medical decision making (see chart for details).  Clinical Course as of 06/07/21 1424  Thu Jun 07, 2021  1111 Preg is positive. Will add quant and check [place].  [CS]  1118 Patient is now G2P0, prior pregnancy was an ectopic on right side treated with methotrexate. No other pregnancies. She is mildly tender on the right side, not guarding. Hemodynamically stable aside from mild tachycardia. Will give IVF while awaiting [place].  [CS]  1201 UA neg for infection.  [CS]  1253 CBC is normal.  [CS]  1318 CMP, lipase are normal.  [CS]  1349 US shows a gestational sac without fetal pole, but consistent with her LMP. Quant is appropriately elevated. There is a cyst on her R ovary which may explain her right sided pelvic pain. Awaiting ABO/RH due to reports of spotting. If Rh+, will plan discharge with close outpatient Ob follow up for further pregnancy care.  [CS]  1417 Reviewed [place] results with patient including cyst. Recommend close Ob follow up. Referred to [facility].  [CS]    Clinical Course User Index [CS] Truddie Hidden, MD    Final Clinical Impression(s) / ED Diagnoses Final diagnoses:  Pregnancy  Cyst of right ovary    Rx / DC Orders ED Discharge Orders     None        Truddie Hidden, MD 06/07/21 1424

## 2021-06-07 NOTE — ED Triage Notes (Signed)
Patient with complaints of abdominal pain for 2 days with nausea vomiting and diarrhea that started today.

## 2021-06-23 ENCOUNTER — Emergency Department (HOSPITAL_COMMUNITY)
Admission: EM | Admit: 2021-06-23 | Discharge: 2021-06-23 | Disposition: A | Discharge status: Home / Self Care | Payer: Self-pay | Attending: Emergency Medicine | Admitting: Emergency Medicine

## 2021-06-23 ENCOUNTER — Encounter (HOSPITAL_COMMUNITY): Payer: Self-pay

## 2021-06-23 ENCOUNTER — Other Ambulatory Visit: Payer: Self-pay

## 2021-06-23 DIAGNOSIS — R109 Unspecified abdominal pain: Secondary | ICD-10-CM

## 2021-06-23 DIAGNOSIS — R3915 Urgency of urination: Secondary | ICD-10-CM | POA: Insufficient documentation

## 2021-06-23 DIAGNOSIS — O200 Threatened abortion: Secondary | ICD-10-CM | POA: Insufficient documentation

## 2021-06-23 DIAGNOSIS — R112 Nausea with vomiting, unspecified: Secondary | ICD-10-CM | POA: Insufficient documentation

## 2021-06-23 LAB — URINALYSIS, ROUTINE W REFLEX MICROSCOPIC
Bilirubin Urine: NEGATIVE
Glucose, UA: NEGATIVE mg/dL
Hgb urine dipstick: NEGATIVE
Ketones, ur: NEGATIVE mg/dL
Leukocytes,Ua: NEGATIVE
Nitrite: NEGATIVE
Protein, ur: NEGATIVE mg/dL
Specific Gravity, Urine: 1.02 (ref 1.005–1.030)
pH: 7.5 (ref 5.0–8.0)

## 2021-06-23 LAB — CBC
HCT: 41.1 % (ref 36.0–46.0)
Hemoglobin: 13.2 g/dL (ref 12.0–15.0)
MCH: 27.7 pg (ref 26.0–34.0)
MCHC: 32.1 g/dL (ref 30.0–36.0)
MCV: 86.2 fL (ref 80.0–100.0)
Platelets: 341 10*3/uL (ref 150–400)
RBC: 4.77 MIL/uL (ref 3.87–5.11)
RDW: 14.7 % (ref 11.5–15.5)
WBC: 9 10*3/uL (ref 4.0–10.5)
nRBC: 0 % (ref 0.0–0.2)

## 2021-06-23 LAB — BASIC METABOLIC PANEL
Anion gap: 6 (ref 5–15)
BUN: 10 mg/dL (ref 6–20)
CO2: 23 mmol/L (ref 22–32)
Calcium: 8.6 mg/dL — ABNORMAL LOW (ref 8.9–10.3)
Chloride: 106 mmol/L (ref 98–111)
Creatinine, Ser: 0.79 mg/dL (ref 0.44–1.00)
GFR, Estimated: >60 mL/min (ref >60)
Glucose, Bld: 90 mg/dL (ref 70–99)
Potassium: 3.3 mmol/L — ABNORMAL LOW (ref 3.5–5.1)
Sodium: 135 mmol/L (ref 135–145)

## 2021-06-23 LAB — HCG, QUANTITATIVE, PREGNANCY: hCG, Beta Chain, Quant, S: 142401 m[IU]/mL — ABNORMAL HIGH (ref <5)

## 2021-06-23 MED ORDER — ACETAMINOPHEN 325 MG PO TABS
650.0000 mg | ORAL_TABLET | Freq: Once | ORAL | Status: AC
  Administered 2021-06-23: 650 mg via ORAL
  Filled 2021-06-23: qty 2

## 2021-06-23 MED ORDER — METOCLOPRAMIDE HCL 5 MG/ML IJ SOLN
10.0000 mg | Freq: Once | INTRAMUSCULAR | Status: AC
Start: 2021-06-23 — End: 2021-06-23
  Administered 2021-06-23: 10 mg via INTRAVENOUS
  Filled 2021-06-23: qty 2

## 2021-06-23 MED ORDER — METOCLOPRAMIDE HCL 10 MG PO TABS: 10.0000 mg | ORAL_TABLET | Freq: Four times a day (QID) | ORAL | 0 refills | Status: DC

## 2021-06-23 MED ORDER — SODIUM CHLORIDE 0.9 % IV BOLUS
1000.0000 mL | Freq: Once | INTRAVENOUS | Status: AC
Start: 2021-06-23 — End: 2021-06-23
  Administered 2021-06-23: 1000 mL via INTRAVENOUS

## 2021-06-23 NOTE — Discharge Instructions (Addendum)
Your testing today showed a hCG level of 142,000, this is up from 5002 weeks ago.  We have done an ultrasound at the bedside, this is not a formal ultrasound but it shows that your uterus has a very small gestational sac, it is not clear whether we can actually see the baby or not, please share this with your OB/GYN and have them evaluate you in the office for the same things.  If you continue to bleed this may be in relation to having a miscarriage, there is nothing that you can do about it but I do want you to come back if you have heavy bleeding or severe pain  Tylenol is safe in pregnancy, 650 mg every 6 hours as needed  Reglan 10 mg every 6 hours as needed for nausea or vomiting, no medication is totally safe in pregnancy but this is one of the safer medicines.  Drink plenty of clear liquids  ER for severe or worsening symptoms

## 2021-06-23 NOTE — ED Notes (Signed)
Went to discharge the patient and go over discharge paperwork.  Patient was not in the room nor in any of the bathrooms.  Was not notified of patient wanting to leave.

## 2021-06-23 NOTE — ED Triage Notes (Signed)
Pt arrives with c/o intermittent ABD cramping. Per pt, she thinks she is about [redacted] weeks pregnant. Pt endorse n/v. Pt had some spotting this morning, but stopped around this afternoon.

## 2021-06-23 NOTE — ED Provider Notes (Signed)
[facility] EMERGENCY DEPARTMENT Provider Note   CSN: [id number] Arrival date & time: 06/23/21  1832     History  Chief Complaint  Patient presents with   Abdominal Pain    Marissa Hicks is a 21 y.o. female.   Abdominal Pain  This patient is a 21 year old female, she is G1, P0 at approximately 7-1/[redacted] weeks gestation with her first pregnancy, she reports that she was in the emergency department a couple of weeks ago which I have verified by medical record review, this showed that she had an intrauterine gestational sac, she had a hCG quant of about 5000, she had nausea vomiting and abdominal cramping with no bleeding.  She comes back today stating that for the last 2 weeks she has had some intermittent cramping with rather persistent nausea and vomiting however today she started to have some bleeding as well.  The patient states that the bleeding occurred while she was on the commode, she wore a pad but had very minimal bleeding and now the bleeding seems to have stopped.  The cramping seems to be in the lower suprapubic area.  She also complains of having some feeling of urinary urgency and not being able to completely empty her bladder which is new.  No fevers or chills, no diarrhea, she has had some nausea and vomiting throughout the day today and is actively dry heaving in the room.  She takes no daily medications, no over-the-counter medications, no antiemetics.  She has not seen OB/GYN yet.  Home Medications Prior to Admission medications   Medication Sig Start Date End Date Taking? Authorizing Provider  metoCLOPramide (REGLAN) 10 MG tablet Take 1 tablet (10 mg total) by mouth every 6 (six) hours. 06/23/21  Yes Eber Hong, MD      Allergies    Patient has no known allergies.    Review of Systems   Review of Systems  Gastrointestinal:  Positive for abdominal pain.  All other systems reviewed and are negative.  Physical Exam Updated Vital Signs BP 117/66 (BP Location:  Left Arm)    Pulse 86    Temp 98.9 F (37.2 C) (Oral)    Resp 17    Ht 1.651 m (5\' 5" )    Wt 74.8 kg    LMP 04/23/2021 (Approximate)    SpO2 100%    BMI 27.46 kg/m  Physical Exam Vitals and nursing note reviewed.  Constitutional:      General: She is not in acute distress.    Appearance: She is well-developed.  HENT:     Head: Normocephalic and atraumatic.     Mouth/Throat:     Pharynx: No oropharyngeal exudate.  Eyes:     General: No scleral icterus.       Right eye: No discharge.        Left eye: No discharge.     Conjunctiva/sclera: Conjunctivae normal.     Pupils: Pupils are equal, round, and reactive to light.  Neck:     Thyroid: No thyromegaly.     Vascular: No JVD.  Cardiovascular:     Rate and Rhythm: Normal rate and regular rhythm.     Heart sounds: Normal heart sounds. No murmur heard.   No friction rub. No gallop.  Pulmonary:     Effort: Pulmonary effort is normal. No respiratory distress.     Breath sounds: Normal breath sounds. No wheezing or rales.  Abdominal:     General: Bowel sounds are normal. There is no distension.  Palpations: Abdomen is soft. There is no mass.     Tenderness: There is abdominal tenderness.     Comments: Mild suprapubic tenderness, no other abdominal tenderness, no CVA tenderness  Musculoskeletal:        General: No tenderness. Normal range of motion.     Cervical back: Normal range of motion and neck supple.  Lymphadenopathy:     Cervical: No cervical adenopathy.  Skin:    General: Skin is warm and dry.     Findings: No erythema or rash.  Neurological:     Mental Status: She is alert.     Coordination: Coordination normal.  Psychiatric:        Behavior: Behavior normal.    ED Results / Procedures / Treatments   Labs (all labs ordered are listed, but only abnormal results are displayed) Labs Reviewed  HCG, QUANTITATIVE, PREGNANCY - Abnormal; Notable for the following components:      Result Value   hCG, Beta Chain, Quant,  S 142,401 (*)    All other components within normal limits  BASIC METABOLIC PANEL - Abnormal; Notable for the following components:   Potassium 3.3 (*)    Calcium 8.6 (*)    All other components within normal limits  URINE CULTURE  CBC  URINALYSIS, ROUTINE W REFLEX MICROSCOPIC    EKG None  Radiology No results found.  Procedures Procedures    Medications Ordered in ED Medications  sodium chloride 0.9 % bolus 1,000 mL (0 mLs Intravenous Stopped 06/23/21 2042)  metoCLOPramide (REGLAN) injection 10 mg (10 mg Intravenous Given 06/23/21 1950)  acetaminophen (TYLENOL) tablet 650 mg (650 mg Oral Given 06/23/21 1938)    ED Course/ Medical Decision Making/ A&P                           Medical Decision Making The patient appears to have some suprapubic tenderness, will need to make sure this is not a urinary tract infection, we will recheck an hCG quant to make sure it is going up appropriately, I would consider that this could be a miscarriage but is extremely unlikely to be ectopic pregnancy given no history of ectopic pregnancy, no history of pregnancy until this 1 and a prior ultrasound that confirmed an intrauterine gestational sac.  The patient will be given IV fluids, antiemetics, will check some basic labs including a quant, patient agreeable to the plan  Problems Addressed: Abdominal cramping: acute illness or injury    Details: Not related to urinary tract infection as the urinalysis was normal, no leukocytosis or anemia Threatened miscarriage: acute illness or injury    Details: hCG is appropriately growing and bedside ultrasound reveals no signs of obvious growing fetus, likely has a gestational sac, clear fluid in the uterus, nothing in the adnexa based on my bedside ultrasound  Amount and/or Complexity of Data Reviewed External Data Reviewed: radiology.    Details: Prior ultrasound reviewed from a couple of weeks ago Labs: ordered. Decision-making details documented in ED  Course.    Details: hCG is progressing up over 140,000 as per expected for normal pregnancy Radiology:     Details: Bedside ultrasound performed by myself, details as above Discussion of management or test interpretation with external provider(s): The patient was given metoclopramide and had significant improvement, IV fluids, total resolution of nausea vomiting and has no abdominal cramping at this time  Risk Prescription drug management. Risk Details: I engaged the patient in discussion about follow-up  versus staying in the emergency department for more hydration versus admission and and at this time it does not appear that admission is warranted.  Stable for discharge, counseling given on use of antipyretics, pain medication including Tylenol and avoiding NSAIDs and metoclopramide for nausea, patient agreeable has follow-up on 17 January in 3 days with OB/GYN           Final Clinical Impression(s) / ED Diagnoses Final diagnoses:  Threatened miscarriage  Abdominal cramping    Rx / DC Orders ED Discharge Orders          Ordered    metoCLOPramide (REGLAN) 10 MG tablet  Every 6 hours        06/23/21 2143              Eber HongMiller, Roshni Burbano, MD 06/23/21 2148

## 2021-06-25 LAB — URINE CULTURE: Culture: <10000 — AB

## 2021-07-05 ENCOUNTER — Emergency Department (HOSPITAL_COMMUNITY): Payer: Self-pay

## 2021-07-05 ENCOUNTER — Other Ambulatory Visit: Payer: Self-pay

## 2021-07-05 ENCOUNTER — Encounter (HOSPITAL_COMMUNITY): Payer: Self-pay | Admitting: *Deleted

## 2021-07-05 ENCOUNTER — Emergency Department (HOSPITAL_COMMUNITY)
Admission: EM | Admit: 2021-07-05 | Discharge: 2021-07-05 | Disposition: A | Discharge status: Home / Self Care | Payer: Self-pay | Attending: Emergency Medicine | Admitting: Emergency Medicine

## 2021-07-05 DIAGNOSIS — O4411 Placenta previa with hemorrhage, first trimester: Secondary | ICD-10-CM | POA: Insufficient documentation

## 2021-07-05 DIAGNOSIS — O418X10 Other specified disorders of amniotic fluid and membranes, first trimester, not applicable or unspecified: Secondary | ICD-10-CM

## 2021-07-05 DIAGNOSIS — Z3A08 8 weeks gestation of pregnancy: Secondary | ICD-10-CM | POA: Insufficient documentation

## 2021-07-05 DIAGNOSIS — Z79899 Other long term (current) drug therapy: Secondary | ICD-10-CM | POA: Insufficient documentation

## 2021-07-05 DIAGNOSIS — O468X1 Other antepartum hemorrhage, first trimester: Secondary | ICD-10-CM

## 2021-07-05 DIAGNOSIS — O200 Threatened abortion: Secondary | ICD-10-CM | POA: Insufficient documentation

## 2021-07-05 LAB — HCG, QUANTITATIVE, PREGNANCY: hCG, Beta Chain, Quant, S: 196613 m[IU]/mL — ABNORMAL HIGH (ref <5)

## 2021-07-05 NOTE — ED Provider Notes (Signed)
[facility] EMERGENCY DEPARTMENT Provider Note  CSN: [id number] Arrival date & time: 07/05/21 4403  History Chief Complaint  Patient presents with   Vaginal Bleeding    Marissa Hicks is a 21 y.o. female G2P0 with reported prior ectopic treated with methotrexate was found to be pregnant on ED visit 12/29, [place] then was nondiagnostic. She was advised to see Ob for follow up but is not scheduled to be seen until next week. She was seen in ED again 1/14 for vaginal spotting, quant had increased appropriately but bedside US was again documented to be non-diagnostic. She was given information regarding threatened miscarriage. Today she repots increased bleeding with clots. No change in her pelvic pain.    Home Medications Prior to Admission medications   Medication Sig Start Date End Date Taking? Authorizing Provider  metoCLOPramide (REGLAN) 10 MG tablet Take 1 tablet (10 mg total) by mouth every 6 (six) hours. Patient not taking: Reported on 07/05/2021 06/23/21   Eber Hong, MD     Allergies    Patient has no known allergies.   Review of Systems   Review of Systems Please see HPI for pertinent positives and negatives  Physical Exam BP (!) 101/58    Pulse 68    Temp 97.7 F (36.5 C) (Oral)    Resp 18    Ht 5\' 5"  (1.651 m)    Wt 73.9 kg    LMP 04/23/2021 (Approximate)    SpO2 100%    BMI 27.12 kg/m   Physical Exam Vitals and nursing note reviewed.  Constitutional:      Appearance: Normal appearance.  HENT:     Head: Normocephalic and atraumatic.     Nose: Nose normal.     Mouth/Throat:     Mouth: Mucous membranes are moist.  Eyes:     Extraocular Movements: Extraocular movements intact.     Conjunctiva/sclera: Conjunctivae normal.  Cardiovascular:     Rate and Rhythm: Normal rate.  Pulmonary:     Effort: Pulmonary effort is normal.     Breath sounds: Normal breath sounds.  Abdominal:     General: Abdomen is flat.     Palpations: Abdomen is soft.     Tenderness:  There is no abdominal tenderness.  Musculoskeletal:        General: No swelling. Normal range of motion.     Cervical back: Neck supple.  Skin:    General: Skin is warm and dry.  Neurological:     General: No focal deficit present.     Mental Status: She is alert.  Psychiatric:        Mood and Affect: Mood normal.    ED Results / Procedures / Treatments   EKG None  Procedures Procedures  Medications Ordered in the ED Medications - No data to display  Initial Impression and Plan  Given patient's worsening bleeding and no 04/25/2021 yet proving IUP, will check quant for trending and send for repeat US. She is known to the Rh+  ED Course   Clinical Course as of 07/05/21 1313  Thu Jul 05, 2021  1108 Quant continues to increase from previous.  [CS]  1311 Jul 07, 2021 shows live IUP at [redacted]w[redacted]d with subchorionic hemorrhage. Discussed this finding with the patient and that it can progress to miscarriage or to normal pregnancy and that there is no way to determine which it may do or any way to intervene. Encouraged to follow up with Ob next week as scheduled.  [CS]  Clinical Course User Index [CS] Pollyann Savoy, MD     MDM Rules/Calculators/A&P Medical Decision Making Problems Addressed: Subchorionic hemorrhage of placenta in first trimester, single or unspecified fetus: acute illness or injury that poses a threat to life or bodily functions Threatened miscarriage: acute illness or injury that poses a threat to life or bodily functions  Amount and/or Complexity of Data Reviewed Labs: ordered. Radiology: ordered. Decision-making details documented in ED Course.  Risk Decision regarding hospitalization.    Final Clinical Impression(s) / ED Diagnoses Final diagnoses:  Threatened miscarriage  Subchorionic hemorrhage of placenta in first trimester, single or unspecified fetus    Rx / DC Orders ED Discharge Orders     None        Pollyann Savoy, MD 07/05/21 1313

## 2021-07-05 NOTE — ED Triage Notes (Signed)
Pt c/o dark vaginal bleeding that started about 30 minutes ago. Pt is [redacted] weeks pregnant and is supposed to have her first OB appt on Tuesday.

## 2021-07-05 NOTE — ED Notes (Signed)
US at bedside

## 2021-07-14 ENCOUNTER — Encounter (HOSPITAL_COMMUNITY): Payer: Self-pay | Admitting: Emergency Medicine

## 2021-07-14 ENCOUNTER — Emergency Department (HOSPITAL_COMMUNITY)
Admission: EM | Admit: 2021-07-14 | Discharge: 2021-07-15 | Disposition: A | Discharge status: Home / Self Care | Payer: Self-pay | Attending: Emergency Medicine | Admitting: Emergency Medicine

## 2021-07-14 DIAGNOSIS — Z3A00 Weeks of gestation of pregnancy not specified: Secondary | ICD-10-CM | POA: Insufficient documentation

## 2021-07-14 DIAGNOSIS — N938 Other specified abnormal uterine and vaginal bleeding: Secondary | ICD-10-CM | POA: Insufficient documentation

## 2021-07-14 DIAGNOSIS — R102 Pelvic and perineal pain: Secondary | ICD-10-CM | POA: Insufficient documentation

## 2021-07-14 DIAGNOSIS — R109 Unspecified abdominal pain: Secondary | ICD-10-CM

## 2021-07-14 DIAGNOSIS — N9489 Other specified conditions associated with female genital organs and menstrual cycle: Secondary | ICD-10-CM | POA: Insufficient documentation

## 2021-07-14 DIAGNOSIS — O26899 Other specified pregnancy related conditions, unspecified trimester: Secondary | ICD-10-CM | POA: Insufficient documentation

## 2021-07-14 DIAGNOSIS — O039 Complete or unspecified spontaneous abortion without complication: Secondary | ICD-10-CM | POA: Insufficient documentation

## 2021-07-14 MED ORDER — SODIUM CHLORIDE 0.9 % IV BOLUS
1000.0000 mL | Freq: Once | INTRAVENOUS | Status: AC
  Administered 2021-07-14: 1000 mL via INTRAVENOUS

## 2021-07-14 MED ORDER — HYDROMORPHONE HCL 1 MG/ML IJ SOLN
1.0000 mg | Freq: Once | INTRAMUSCULAR | Status: AC
  Administered 2021-07-14: 1 mg via INTRAVENOUS
  Filled 2021-07-14: qty 1

## 2021-07-14 MED ORDER — ONDANSETRON HCL 4 MG/2ML IJ SOLN
4.0000 mg | Freq: Once | INTRAMUSCULAR | Status: AC
Start: 2021-07-14 — End: 2021-07-14
  Administered 2021-07-14: 4 mg via INTRAVENOUS
  Filled 2021-07-14: qty 2

## 2021-07-14 NOTE — ED Provider Notes (Signed)
[facility] Emergency Department Provider Note MRN:  [id number]  Arrival date & time: 07/15/21     Chief Complaint   Pelvic pain History of Present Illness   Marissa Hicks is a 21 y.o. year-old female with no pertinent past medical history presenting to the ED with chief complaint of pelvic pain.  Severe lower abdominal/pelvic pain for the past 2 hours.  Associated with vaginal bleeding.  Recently here in the emergency department with threatened miscarriage.  Review of Systems  A thorough review of systems was obtained and all systems are negative except as noted in the HPI and PMH.   Patient's Health History   History reviewed. No pertinent past medical history.  History reviewed. No pertinent surgical history.  Family History  Problem Relation Age of Onset   Diabetes Maternal Grandmother    Hypertension Maternal Grandmother    Hypertension Maternal Grandfather     Social History   Socioeconomic History   Marital status: Single    Spouse name: Not on file   Number of children: Not on file   Years of education: Not on file   Highest education level: Not on file  Occupational History   Not on file  Tobacco Use   Smoking status: Never   Smokeless tobacco: Never  Vaping Use   Vaping Use: Never used  Substance and Sexual Activity   Alcohol use: Yes    Comment: occ   Drug use: No   Sexual activity: Yes    Birth control/protection: None  Other Topics Concern   Not on file  Social History Narrative   Not on file   Social Determinants of Health   Financial Resource Strain: Not on file  Food Insecurity: Not on file  Transportation Needs: Not on file  Physical Activity: Not on file  Stress: Not on file  Social Connections: Not on file  Intimate Partner Violence: Not on file     Physical Exam   Vitals:   07/15/21 0000 07/15/21 0216  BP: 116/64 130/81  Pulse: (!) 104 89  Resp: 17 17  Temp:    SpO2: 100% 100%    CONSTITUTIONAL:  Well-appearing, NAD ill-appearing, in significant distress due to pain, actively vomiting NEURO/PSYCH:  Alert and oriented x 3, no focal deficits EYES:  eyes equal and reactive ENT/NECK:  no LAD, no JVD CARDIO: Regular rate, well-perfused, normal S1 and S2 PULM:  CTAB no wheezing or rhonchi GI/GU:  non-distended, moderate lower abdominal tenderness MSK/SPINE:  No gross deformities, no edema SKIN:  no rash, atraumatic   *Additional and/or pertinent findings included in MDM below  Diagnostic and Interventional Summary    EKG Interpretation  Date/Time:    Ventricular Rate:    PR Interval:    QRS Duration:   QT Interval:    QTC Calculation:   R Axis:     Text Interpretation:         Labs Reviewed  CBC - Abnormal; Notable for the following components:      Result Value   WBC 13.7 (*)    All other components within normal limits  COMPREHENSIVE METABOLIC PANEL - Abnormal; Notable for the following components:   Sodium 134 (*)    Potassium 2.8 (*)    CO2 16 (*)    Glucose, Bld 125 (*)    All other components within normal limits  HCG, QUANTITATIVE, PREGNANCY - Abnormal; Notable for the following components:   hCG, Beta Neomia Dear 117,582 (*)  All other components within normal limits  PROTIME-INR  TYPE AND SCREEN    US OB Comp Less 14 Wks  Final Result    US PELVIC DOPPLER LIMITED  Final Result      Medications  sodium chloride 0.9 % bolus 1,000 mL (0 mLs Intravenous Stopped 07/15/21 0018)  HYDROmorphone (DILAUDID) injection 1 mg (1 mg Intravenous Given 07/14/21 2344)  ondansetron (ZOFRAN) injection 4 mg (4 mg Intravenous Given 07/14/21 2344)     Procedures  /  Critical Care Procedures  ED Course and Medical Decision Making  Initial Impression and Ddx Differential diagnosis includes miscarriage, retained products, ectopic is considered but unlikely given the IUP seen a few weeks ago.  Appendicitis thought to be less likely as well but is considered.  Awaiting  symptomatic management, labs and will reassess.  Given patient's severe pain, ovarian torsion is also considered.  Ultrasound technician in route.  Past medical/surgical history that increases complexity of ED encounter: Current pregnancy  Interpretation of Diagnostics Labs reassuring, hCG is downtrending.  Dr. Excell Seltzer of OB/GYN is consulted, not uncommon for hCG to be downtrending, quantitative hCG not a helpful test once an IUP with heartbeat is detected.  Knowing this, her work-up is overall reassuring, we are seeing progression of the size of the fetus compared to prior ultrasound, the subchorionic hemorrhages unchanged.  Patient Reassessment and Ultimate Disposition/Management Patient feeling much better on reassessment, abdomen soft and nontender, appropriate for discharge with follow-up.  Patient management required discussion with the following services or consulting groups:  OB/GYN  Complexity of Problems Addressed Acute illness or injury that poses threat of life of bodily function  Additional Data Reviewed and Analyzed Further history obtained from: Prior ED visit notes, Recent Consult notes, and Prior labs/imaging results  Factors Impacting ED Encounter Risk Use of parenteral controlled substances  Barth Kirks. [place] Small, [place] [email or web address]  Final Clinical Impressions(s) / ED Diagnoses     ICD-10-CM   1. Pelvic pain affecting pregnancy, antepartum  O26.899    R10.2     2. Miscarriage  O03.9 US OB Comp Less 14 Wks    US OB Comp Less 14 Wks    CANCELED: US OB LESS THAN 14 WEEKS W/ OB TRANSVAGINAL AND DOPPLER    CANCELED: US OB LESS THAN 14 WEEKS W/ OB TRANSVAGINAL AND DOPPLER    3. Abdominal pain  R10.9 US PELVIC DOPPLER LIMITED    US PELVIC DOPPLER LIMITED      ED Discharge Orders     None        Discharge Instructions Discussed with and Provided to Patient:     Discharge Instructions      You were  evaluated in the Emergency Department and after careful evaluation, we did not find any emergent condition requiring admission or further testing in the hospital.  Your exam/testing today was overall reassuring.  Recommend close follow-up with your OB/GYN to discuss her symptoms.  Please return to the Emergency Department if you experience any worsening of your condition.  Thank you for allowing [place] to be a part of your care.        Maudie Flakes, MD 07/15/21 [phone]

## 2021-07-14 NOTE — ED Triage Notes (Signed)
Pt brought in by family for possible miscarriage. Pt states abd pain that started about 2 hrs prior to arrival. Pt states she is approx [redacted] weeks pregnant.

## 2021-07-15 ENCOUNTER — Emergency Department (HOSPITAL_COMMUNITY): Payer: Self-pay

## 2021-07-15 LAB — COMPREHENSIVE METABOLIC PANEL
ALT: 25 U/L (ref 0–44)
AST: 28 U/L (ref 15–41)
Albumin: 4.1 g/dL (ref 3.5–5.0)
Alkaline Phosphatase: 68 U/L (ref 38–126)
Anion gap: 12 (ref 5–15)
BUN: 8 mg/dL (ref 6–20)
CO2: 16 mmol/L — ABNORMAL LOW (ref 22–32)
Calcium: 9.5 mg/dL (ref 8.9–10.3)
Chloride: 106 mmol/L (ref 98–111)
Creatinine, Ser: 0.7 mg/dL (ref 0.44–1.00)
GFR, Estimated: >60 mL/min (ref >60)
Glucose, Bld: 125 mg/dL — ABNORMAL HIGH (ref 70–99)
Potassium: 2.8 mmol/L — ABNORMAL LOW (ref 3.5–5.1)
Sodium: 134 mmol/L — ABNORMAL LOW (ref 135–145)
Total Bilirubin: 0.6 mg/dL (ref 0.3–1.2)
Total Protein: 8 g/dL (ref 6.5–8.1)

## 2021-07-15 LAB — CBC
HCT: 37 % (ref 36.0–46.0)
Hemoglobin: 12 g/dL (ref 12.0–15.0)
MCH: 26.8 pg (ref 26.0–34.0)
MCHC: 32.4 g/dL (ref 30.0–36.0)
MCV: 82.8 fL (ref 80.0–100.0)
Platelets: 384 10*3/uL (ref 150–400)
RBC: 4.47 MIL/uL (ref 3.87–5.11)
RDW: 14 % (ref 11.5–15.5)
WBC: 13.7 10*3/uL — ABNORMAL HIGH (ref 4.0–10.5)
nRBC: 0 % (ref 0.0–0.2)

## 2021-07-15 LAB — TYPE AND SCREEN
ABO/RH(D): A POS
Antibody Screen: NEGATIVE

## 2021-07-15 LAB — PROTIME-INR
INR: 1 (ref 0.8–1.2)
Prothrombin Time: 12.8 seconds (ref 11.4–15.2)

## 2021-07-15 LAB — HCG, QUANTITATIVE, PREGNANCY: hCG, Beta Chain, Quant, S: 117582 m[IU]/mL — ABNORMAL HIGH (ref <5)

## 2021-07-15 NOTE — ED Notes (Signed)
Patient transported to Ultrasound 

## 2021-07-15 NOTE — Discharge Instructions (Signed)
You were evaluated in the Emergency Department and after careful evaluation, we did not find any emergent condition requiring admission or further testing in the hospital.  Your exam/testing today was overall reassuring.  Recommend close follow-up with your OB/GYN to discuss her symptoms.  Please return to the Emergency Department if you experience any worsening of your condition.  Thank you for allowing [place] to be a part of your care.

## 2022-03-11 ENCOUNTER — Emergency Department (HOSPITAL_COMMUNITY): Payer: Self-pay

## 2022-03-11 ENCOUNTER — Emergency Department (HOSPITAL_COMMUNITY)
Admission: EM | Admit: 2022-03-11 | Discharge: 2022-03-11 | Disposition: A | Discharge status: Home / Self Care | Payer: Self-pay | Attending: Emergency Medicine | Admitting: Emergency Medicine

## 2022-03-11 ENCOUNTER — Encounter (HOSPITAL_COMMUNITY): Payer: Self-pay

## 2022-03-11 ENCOUNTER — Other Ambulatory Visit: Payer: Self-pay

## 2022-03-11 DIAGNOSIS — Z3A00 Weeks of gestation of pregnancy not specified: Secondary | ICD-10-CM | POA: Insufficient documentation

## 2022-03-11 DIAGNOSIS — N939 Abnormal uterine and vaginal bleeding, unspecified: Secondary | ICD-10-CM

## 2022-03-11 DIAGNOSIS — R1030 Lower abdominal pain, unspecified: Secondary | ICD-10-CM | POA: Insufficient documentation

## 2022-03-11 DIAGNOSIS — O209 Hemorrhage in early pregnancy, unspecified: Secondary | ICD-10-CM | POA: Insufficient documentation

## 2022-03-11 DIAGNOSIS — R102 Pelvic and perineal pain: Secondary | ICD-10-CM | POA: Insufficient documentation

## 2022-03-11 LAB — CBC
HCT: 42.6 % (ref 36.0–46.0)
Hemoglobin: 13.9 g/dL (ref 12.0–15.0)
MCH: 27.3 pg (ref 26.0–34.0)
MCHC: 32.6 g/dL (ref 30.0–36.0)
MCV: 83.5 fL (ref 80.0–100.0)
Platelets: 408 10*3/uL — ABNORMAL HIGH (ref 150–400)
RBC: 5.1 MIL/uL (ref 3.87–5.11)
RDW: 14.7 % (ref 11.5–15.5)
WBC: 4.9 10*3/uL (ref 4.0–10.5)
nRBC: 0 % (ref 0.0–0.2)

## 2022-03-11 LAB — COMPREHENSIVE METABOLIC PANEL
ALT: 26 U/L (ref 0–44)
AST: 22 U/L (ref 15–41)
Albumin: 4.4 g/dL (ref 3.5–5.0)
Alkaline Phosphatase: 91 U/L (ref 38–126)
Anion gap: 10 (ref 5–15)
BUN: 12 mg/dL (ref 6–20)
CO2: 25 mmol/L (ref 22–32)
Calcium: 9.3 mg/dL (ref 8.9–10.3)
Chloride: 105 mmol/L (ref 98–111)
Creatinine, Ser: 0.66 mg/dL (ref 0.44–1.00)
GFR, Estimated: >60 mL/min (ref >60)
Glucose, Bld: 98 mg/dL (ref 70–99)
Potassium: 3.7 mmol/L (ref 3.5–5.1)
Sodium: 140 mmol/L (ref 135–145)
Total Bilirubin: 1.1 mg/dL (ref 0.3–1.2)
Total Protein: 8.6 g/dL — ABNORMAL HIGH (ref 6.5–8.1)

## 2022-03-11 LAB — URINALYSIS, ROUTINE W REFLEX MICROSCOPIC
Bilirubin Urine: NEGATIVE
Glucose, UA: NEGATIVE mg/dL
Hgb urine dipstick: NEGATIVE
Ketones, ur: NEGATIVE mg/dL
Leukocytes,Ua: NEGATIVE
Nitrite: NEGATIVE
Protein, ur: NEGATIVE mg/dL
Specific Gravity, Urine: 1.017 (ref 1.005–1.030)
pH: 6 (ref 5.0–8.0)

## 2022-03-11 LAB — HCG, QUANTITATIVE, PREGNANCY: hCG, Beta Chain, Quant, S: 24 m[IU]/mL — ABNORMAL HIGH (ref <5)

## 2022-03-11 LAB — POC URINE PREG, ED: Preg Test, Ur: NEGATIVE

## 2022-03-11 LAB — ABO/RH: ABO/RH(D): A POS

## 2022-03-11 MED ORDER — ACETAMINOPHEN 500 MG PO TABS
1000.0000 mg | ORAL_TABLET | Freq: Once | ORAL | Status: AC
  Administered 2022-03-11: 1000 mg via ORAL
  Filled 2022-03-11: qty 2

## 2022-03-11 NOTE — Discharge Instructions (Signed)
Call make an appointment to follow-up with Dr. Elonda Husky or one of his colleagues in the next 2 to 3 days.  If you have severe pain or bleeding he should return to the emergency department or go to [facility]  Take Tylenol or Motrin for pain

## 2022-03-11 NOTE — ED Notes (Addendum)
Pt alert, NAD, calm, interactive, resps e/u, c/o thirst, lightheaded, and mild nausea. Denies pain. Pending [place], next on list. VSS.

## 2022-03-11 NOTE — ED Notes (Signed)
Not in room

## 2022-03-11 NOTE — ED Notes (Signed)
Patient has not returned to get her IV pulled or for discharge paperwork. Patient called and call was sent to voicemail.

## 2022-03-11 NOTE — ED Provider Notes (Signed)
[facility] Emergency Department Provider Note MRN:  [id number]  Arrival date & time: 03/11/22     Chief Complaint   Abdominal Pain   History of Present Illness   Marissa Hicks is a 21 y.o. year-old female with no pertinent past medical history presenting to the ED with chief complaint of abdominal pain.  Bilateral lower abdominal/pelvic pain for the past several days.  Having some vaginal bleeding as well recently.  Had a positive pregnancy test recently.  Review of Systems  A thorough review of systems was obtained and all systems are negative except as noted in the HPI and PMH.   Patient's Health History   History reviewed. No pertinent past medical history.  History reviewed. No pertinent surgical history.  Family History  Problem Relation Age of Onset   Diabetes Maternal Grandmother    Hypertension Maternal Grandmother    Hypertension Maternal Grandfather     Social History   Socioeconomic History   Marital status: Single    Spouse name: Not on file   Number of children: Not on file   Years of education: Not on file   Highest education level: Not on file  Occupational History   Not on file  Tobacco Use   Smoking status: Never   Smokeless tobacco: Never  Vaping Use   Vaping Use: Never used  Substance and Sexual Activity   Alcohol use: Yes    Comment: occ   Drug use: No   Sexual activity: Yes    Birth control/protection: None  Other Topics Concern   Not on file  Social History Narrative   Not on file   Social Determinants of Health   Financial Resource Strain: Not on file  Food Insecurity: Not on file  Transportation Needs: Not on file  Physical Activity: Not on file  Stress: Not on file  Social Connections: Not on file  Intimate Partner Violence: Not on file     Physical Exam   Vitals:   03/11/22 0346  BP: 122/84  Pulse: 87  Resp: 20  Temp: 98.1 F (36.7 C)  SpO2: 98%    CONSTITUTIONAL: Well-appearing,  NAD NEURO/PSYCH:  Alert and oriented x 3, no focal deficits EYES:  eyes equal and reactive ENT/NECK:  no LAD, no JVD CARDIO: Regular rate, well-perfused, normal S1 and S2 PULM:  CTAB no wheezing or rhonchi GI/GU:  non-distended, non-tender MSK/SPINE:  No gross deformities, no edema SKIN:  no rash, atraumatic   *Additional and/or pertinent findings included in MDM below  Diagnostic and Interventional Summary    EKG Interpretation  Date/Time:    Ventricular Rate:    PR Interval:    QRS Duration:   QT Interval:    QTC Calculation:   R Axis:     Text Interpretation:         Labs Reviewed  CBC - Abnormal; Notable for the following components:      Result Value   Platelets 408 (*)    All other components within normal limits  COMPREHENSIVE METABOLIC PANEL - Abnormal; Notable for the following components:   Total Protein 8.6 (*)    All other components within normal limits  HCG, QUANTITATIVE, PREGNANCY - Abnormal; Notable for the following components:   hCG, Beta Chain, Quant, S 24 (*)    All other components within normal limits  URINALYSIS, ROUTINE W REFLEX MICROSCOPIC  POC URINE PREG, ED  ABO/RH    US OB Comp < 14 Wks    (  Results Pending)    Medications  acetaminophen (TYLENOL) tablet 1,000 mg (1,000 mg Oral Given 03/11/22 0553)     Procedures  /  Critical Care Procedures  ED Course and Medical Decision Making  Initial Impression and Ddx Differential diagnosis includes ectopic pregnancy, miscarriage, bleeding of normal pregnancy, appendicitis, retained products  Past medical/surgical history that increases complexity of ED encounter: None  Interpretation of Diagnostics I personally reviewed the laboratory assessment and my interpretation is as follows: hCG urine is negative.  Labs reassuring with no significant blood count or electrolyte disturbance.  Quantitative hCG is weakly positive, favoring due to miscarriage and I suspect this is a downtrending value.   Will obtain ultrasound to further evaluate for ectopic, retained products, etc.    Patient Reassessment and Ultimate Disposition/Management     Signed out to oncoming provider.  Patient management required discussion with the following services or consulting groups:  None  Complexity of Problems Addressed Acute illness or injury that poses threat of life of bodily function  Additional Data Reviewed and Analyzed Further history obtained from: None  Additional Factors Impacting ED Encounter Risk None  Elmer Sow. Pilar Plate, MD [facility] [email or web address]  Final Clinical Impressions(s) / ED Diagnoses     ICD-10-CM   1. Lower abdominal pain  R10.30     2. Vaginal bleeding  N93.9       ED Discharge Orders     None        Discharge Instructions Discussed with and Provided to Patient:   Discharge Instructions   None      Sabas Sous, MD 03/11/22 [phone]

## 2022-03-11 NOTE — ED Notes (Signed)
Patient left before getting discharge paperwork. Patient was called by the RN and she stated " I'm in the car I will come in get the paperwork and get the IV removed." This tech and another RN went looking for the patient in the lobby and patient was not located. Attempted to call patient again and patient sent the number to voicemail.

## 2022-03-11 NOTE — ED Provider Notes (Signed)
Pelvic ultrasound was unremarkable.  Patient does have a positive quantitative beta-hCG.  Repeat history and exam showed patient with no pain now and no significant bleeding.  Abdomen not tender.  She will take Tylenol or Motrin for pain and follow-up with OB/GYN in the next 2 to 3 days and return sooner if severe bleeding or pain   Milton Ferguson, MD 03/11/22 [phone]

## 2022-03-11 NOTE — ED Notes (Signed)
Pt not in room.

## 2022-03-11 NOTE — ED Triage Notes (Signed)
Pt arrived via POV w c/o positive pregnancy test believes that she is approx 6 weeks. Stated that last week she was bleeding heavily with clots then it stopped. Pt states that again today she began to bleed heavily with clots and cramping, the bleeding stopped, but the cramping did not. Pt has not been seen by ob.

## 2022-03-11 NOTE — ED Notes (Signed)
Called pt on cell phone, she has not left, she was in her car. She is coming back in to be d/c'd and have IV removed.

## 2022-03-24 ENCOUNTER — Other Ambulatory Visit: Payer: Self-pay

## 2022-03-24 ENCOUNTER — Emergency Department (HOSPITAL_COMMUNITY)
Admission: EM | Admit: 2022-03-24 | Discharge: 2022-03-24 | Discharge status: Against Medical Advice | Payer: Self-pay | Attending: Emergency Medicine | Admitting: Emergency Medicine

## 2022-03-24 ENCOUNTER — Encounter (HOSPITAL_COMMUNITY): Payer: Self-pay | Admitting: *Deleted

## 2022-03-24 DIAGNOSIS — Z5321 Procedure and treatment not carried out due to patient leaving prior to being seen by health care provider: Secondary | ICD-10-CM | POA: Insufficient documentation

## 2022-03-24 DIAGNOSIS — R1030 Lower abdominal pain, unspecified: Secondary | ICD-10-CM | POA: Insufficient documentation

## 2022-03-24 DIAGNOSIS — R112 Nausea with vomiting, unspecified: Secondary | ICD-10-CM | POA: Insufficient documentation

## 2022-03-24 LAB — COMPREHENSIVE METABOLIC PANEL
ALT: 21 U/L (ref 0–44)
AST: 24 U/L (ref 15–41)
Albumin: 4 g/dL (ref 3.5–5.0)
Alkaline Phosphatase: 81 U/L (ref 38–126)
Anion gap: 10 (ref 5–15)
BUN: 15 mg/dL (ref 6–20)
CO2: 22 mmol/L (ref 22–32)
Calcium: 9.1 mg/dL (ref 8.9–10.3)
Chloride: 111 mmol/L (ref 98–111)
Creatinine, Ser: 0.84 mg/dL (ref 0.44–1.00)
GFR, Estimated: >60 mL/min (ref >60)
Glucose, Bld: 91 mg/dL (ref 70–99)
Potassium: 3.8 mmol/L (ref 3.5–5.1)
Sodium: 143 mmol/L (ref 135–145)
Total Bilirubin: 0.3 mg/dL (ref 0.3–1.2)
Total Protein: 7.3 g/dL (ref 6.5–8.1)

## 2022-03-24 LAB — CBC
HCT: 38.1 % (ref 36.0–46.0)
Hemoglobin: 12.3 g/dL (ref 12.0–15.0)
MCH: 27.1 pg (ref 26.0–34.0)
MCHC: 32.3 g/dL (ref 30.0–36.0)
MCV: 83.9 fL (ref 80.0–100.0)
Platelets: 376 10*3/uL (ref 150–400)
RBC: 4.54 MIL/uL (ref 3.87–5.11)
RDW: 14.9 % (ref 11.5–15.5)
WBC: 4.6 10*3/uL (ref 4.0–10.5)
nRBC: 0 % (ref 0.0–0.2)

## 2022-03-24 LAB — URINALYSIS, ROUTINE W REFLEX MICROSCOPIC
Bilirubin Urine: NEGATIVE
Glucose, UA: NEGATIVE mg/dL
Hgb urine dipstick: NEGATIVE
Ketones, ur: NEGATIVE mg/dL
Leukocytes,Ua: NEGATIVE
Nitrite: NEGATIVE
Protein, ur: NEGATIVE mg/dL
Specific Gravity, Urine: 1.025 (ref 1.005–1.030)
pH: 6 (ref 5.0–8.0)

## 2022-03-24 LAB — PREGNANCY, URINE: Preg Test, Ur: NEGATIVE

## 2022-03-24 LAB — I-STAT BETA HCG BLOOD, ED (MC, WL, AP ONLY): I-stat hCG, quantitative: <5 m[IU]/mL (ref <5)

## 2022-03-24 LAB — LIPASE, BLOOD: Lipase: 33 U/L (ref 11–51)

## 2022-03-24 MED ORDER — ONDANSETRON 4 MG PO TBDP
4.0000 mg | ORAL_TABLET | Freq: Once | ORAL | Status: AC
  Administered 2022-03-24: 4 mg via ORAL
  Filled 2022-03-24: qty 1

## 2022-03-24 NOTE — ED Triage Notes (Signed)
Pt states she had a recent possible pregnancy test. She had sudden onset of lower abdominal pain and nausea tonight. Pt denies vaginal discharge or bleeding or any urinary discomfort.  Pt also belching. States she only vomited once

## 2022-03-24 NOTE — ED Notes (Signed)
This NT and Jenna(NT) checked vitals on the whole ED and checked back hallway and outside. Pt is no longer here.

## 2022-03-24 NOTE — ED Provider Triage Note (Signed)
Emergency Medicine Provider Triage Evaluation Note  Marissa Hicks , a 21 y.o. female  was evaluated in triage.  Pt complains of sudden onset lower abdominal pain this evening, single episode associated V with NBNB emesis. ? Pregnancy? + HCG of 24 at [facility] on 03/11/22. Unknown LMP.  Review of Systems  Positive: As above Negative: D/fevers  Physical Exam  BP 138/86   Pulse 87   Temp 97.9 F (36.6 C) (Oral)   Resp 16   LMP 01/28/2022 (Approximate)   SpO2 96%  Gen:   Awake, no distress   Resp:  Normal effort  MSK:   Moves extremities without difficulty  Other:  Lower abdomen TTP bilaterally.   Medical Decision Making  Medically screening exam initiated at 5:25 AM.  Appropriate orders placed.  Marissa Hicks was informed that the remainder of the evaluation will be completed by another provider, this initial triage assessment does not replace that evaluation, and the importance of remaining in the ED until their evaluation is complete.  This chart was dictated using voice recognition software, Dragon. Despite the best efforts of this provider to proofread and correct errors, errors may still occur which can change documentation meaning.    Emeline Darling, PA-C 03/24/22 [phone]

## 2022-07-02 ENCOUNTER — Encounter (HOSPITAL_COMMUNITY): Payer: Self-pay

## 2022-07-02 ENCOUNTER — Emergency Department (HOSPITAL_COMMUNITY)
Admission: EM | Admit: 2022-07-02 | Discharge: 2022-07-02 | Discharge status: Against Medical Advice | Payer: Self-pay | Attending: Emergency Medicine | Admitting: Emergency Medicine

## 2022-07-02 ENCOUNTER — Other Ambulatory Visit: Payer: Self-pay

## 2022-07-02 DIAGNOSIS — R1030 Lower abdominal pain, unspecified: Secondary | ICD-10-CM | POA: Insufficient documentation

## 2022-07-02 DIAGNOSIS — Z5321 Procedure and treatment not carried out due to patient leaving prior to being seen by health care provider: Secondary | ICD-10-CM | POA: Insufficient documentation

## 2022-07-02 NOTE — ED Triage Notes (Signed)
Pt reports she has not had a menstrual cycle in 2 months and has had negative home pregnancy tests and is now having lower abd cramping.

## 2022-11-22 ENCOUNTER — Emergency Department (HOSPITAL_COMMUNITY)
Admission: EM | Admit: 2022-11-22 | Discharge: 2022-11-22 | Disposition: A | Discharge status: Home / Self Care | Payer: Self-pay | Attending: Emergency Medicine | Admitting: Emergency Medicine

## 2022-11-22 ENCOUNTER — Other Ambulatory Visit: Payer: Self-pay

## 2022-11-22 ENCOUNTER — Encounter (HOSPITAL_COMMUNITY): Payer: Self-pay | Admitting: Emergency Medicine

## 2022-11-22 DIAGNOSIS — Z91040 Latex allergy status: Secondary | ICD-10-CM | POA: Insufficient documentation

## 2022-11-22 DIAGNOSIS — B9689 Other specified bacterial agents as the cause of diseases classified elsewhere: Secondary | ICD-10-CM

## 2022-11-22 DIAGNOSIS — N760 Acute vaginitis: Secondary | ICD-10-CM | POA: Insufficient documentation

## 2022-11-22 LAB — CBC WITH DIFFERENTIAL/PLATELET
Abs Immature Granulocytes: 0.04 10*3/uL (ref 0.00–0.07)
Basophils Absolute: 0.1 10*3/uL (ref 0.0–0.1)
Basophils Relative: 1 %
Eosinophils Absolute: 0.3 10*3/uL (ref 0.0–0.5)
Eosinophils Relative: 4 %
HCT: 39.7 % (ref 36.0–46.0)
Hemoglobin: 12.7 g/dL (ref 12.0–15.0)
Immature Granulocytes: 1 %
Lymphocytes Relative: 20 %
Lymphs Abs: 1.6 10*3/uL (ref 0.7–4.0)
MCH: 27.1 pg (ref 26.0–34.0)
MCHC: 32 g/dL (ref 30.0–36.0)
MCV: 84.8 fL (ref 80.0–100.0)
Monocytes Absolute: 0.6 10*3/uL (ref 0.1–1.0)
Monocytes Relative: 8 %
Neutro Abs: 5.3 10*3/uL (ref 1.7–7.7)
Neutrophils Relative %: 66 %
Platelets: 377 10*3/uL (ref 150–400)
RBC: 4.68 MIL/uL (ref 3.87–5.11)
RDW: 15.1 % (ref 11.5–15.5)
WBC: 7.9 10*3/uL (ref 4.0–10.5)
nRBC: 0 % (ref 0.0–0.2)

## 2022-11-22 LAB — URINALYSIS, ROUTINE W REFLEX MICROSCOPIC
Bacteria, UA: NONE SEEN
Bilirubin Urine: NEGATIVE
Glucose, UA: NEGATIVE mg/dL
Hgb urine dipstick: NEGATIVE
Ketones, ur: NEGATIVE mg/dL
Leukocytes,Ua: NEGATIVE
Nitrite: NEGATIVE
Protein, ur: 30 mg/dL — AB
Specific Gravity, Urine: 1.024 (ref 1.005–1.030)
pH: 8 (ref 5.0–8.0)

## 2022-11-22 LAB — COMPREHENSIVE METABOLIC PANEL
ALT: 33 U/L (ref 0–44)
AST: 23 U/L (ref 15–41)
Albumin: 4.2 g/dL (ref 3.5–5.0)
Alkaline Phosphatase: 78 U/L (ref 38–126)
Anion gap: 8 (ref 5–15)
BUN: 12 mg/dL (ref 6–20)
CO2: 23 mmol/L (ref 22–32)
Calcium: 8.9 mg/dL (ref 8.9–10.3)
Chloride: 105 mmol/L (ref 98–111)
Creatinine, Ser: 0.75 mg/dL (ref 0.44–1.00)
GFR, Estimated: >60 mL/min (ref >60)
Glucose, Bld: 86 mg/dL (ref 70–99)
Potassium: 3.6 mmol/L (ref 3.5–5.1)
Sodium: 136 mmol/L (ref 135–145)
Total Bilirubin: 0.9 mg/dL (ref 0.3–1.2)
Total Protein: 7.7 g/dL (ref 6.5–8.1)

## 2022-11-22 LAB — WET PREP, GENITAL
Sperm: NONE SEEN
Trich, Wet Prep: NONE SEEN
WBC, Wet Prep HPF POC: <10 (ref <10)
Yeast Wet Prep HPF POC: NONE SEEN

## 2022-11-22 LAB — LIPASE, BLOOD: Lipase: 34 U/L (ref 11–51)

## 2022-11-22 LAB — POC URINE PREG, ED: Preg Test, Ur: NEGATIVE

## 2022-11-22 MED ORDER — IBUPROFEN 800 MG PO TABS
800.0000 mg | ORAL_TABLET | Freq: Once | ORAL | Status: AC
  Administered 2022-11-22: 800 mg via ORAL
  Filled 2022-11-22: qty 1

## 2022-11-22 MED ORDER — METRONIDAZOLE 500 MG PO TABS: 500.0000 mg | ORAL_TABLET | Freq: Two times a day (BID) | ORAL | 0 refills | Status: DC

## 2022-11-22 NOTE — ED Notes (Addendum)
Per NT, pt informed NT that she "was going to get something out of her car". Pt has not returned as of 10 minutes ago, PA-C made aware. Attempted to call pt, unable to reach her at this time

## 2022-11-22 NOTE — ED Provider Notes (Signed)
[place] EM[facility]GENCY DEPARTMENT AT [facility] Provider Note   CSN: [id number] Arrival date & time: 11/22/22  1300     History {Add pertinent medical, surgical, social history, OB history to HPI:1} Chief Complaint  Patient presents with   Abdominal Pain    Marissa Hicks is a 22 y.o. female otherwise healthy presents today for evaluation of abdominal pain.  States that she started to have abdominal pain around 4 AM today.  The pain is in her lower abdomen, sharp, constant.  Denies any aggravating or alleviating factors.  States she usually have menstrual period at the beginning of the month but she did not have it this month.  Last menstrual cycle was beginning of May.  She denies any fever, urinary symptoms, blood in her stool or urine, vaginal bleeding, abnormal vaginal discharge.She is sexually active. States she had STI test done at the [facility] department last week and was told everything was negative.     Abdominal Pain    History reviewed. No pertinent past medical history. History reviewed. No pertinent surgical history.   Home Medications Prior to Admission medications   Medication Sig Start Date End Date Taking? Authorizing Provider  metoCLOPramide (REGLAN) 10 MG tablet Take 1 tablet (10 mg total) by mouth every 6 (six) hours. Patient not taking: Reported on 07/05/2021 06/23/21   Eber Hong, MD      Allergies    Latex    Review of Systems   Review of Systems  Gastrointestinal:  Positive for abdominal pain.    Physical Exam Updated Vital Signs BP 128/73   Pulse 86   Temp 98 F (36.7 C) (Oral)   Resp 18   Ht 5\' 5"  (1.651 m)   Wt 76.2 kg   SpO2 99%   BMI 27.96 kg/m  Physical Exam Vitals and nursing note reviewed.  Constitutional:      Appearance: Normal appearance.  HENT:     Head: Normocephalic and atraumatic.     Mouth/Throat:     Mouth: Mucous membranes are moist.  Eyes:     General: No scleral icterus. Cardiovascular:     Rate and  Rhythm: Normal rate and regular rhythm.     Pulses: Normal pulses.     Heart sounds: Normal heart sounds.  Pulmonary:     Effort: Pulmonary effort is normal.     Breath sounds: Normal breath sounds.  Abdominal:     General: Abdomen is flat.     Palpations: Abdomen is soft.     Tenderness: There is no abdominal tenderness.  Musculoskeletal:        General: No deformity.  Skin:    General: Skin is warm.     Findings: No rash.  Neurological:     General: No focal deficit present.     Mental Status: She is alert.  Psychiatric:        Mood and Affect: Mood normal.     ED Results / Procedures / Treatments   Labs (all labs ordered are listed, but only abnormal results are displayed) Labs Reviewed  URINALYSIS, ROUTINE W REFLEX MICROSCOPIC  POC URINE PREG, ED    EKG None  Radiology No results found.  Procedures Procedures  {Document cardiac monitor, telemetry assessment procedure when appropriate:1}  Medications Ordered in ED Medications - No data to display  ED Course/ Medical Decision Making/ A&P   {   Click here for ABCD2, HEART and other calculatorsREFRESH Note before signing :1}  Medical Decision Making Amount and/or Complexity of Data Reviewed Labs: ordered.   This patient presents to the ED for ***, this involves an extensive number of treatment options, and is a complaint that carries with a high risk of complications and morbidity.  The differential diagnosis includes ***.  This is not an exhaustive list.  Lab tests: I ordered and personally interpreted labs.  The pertinent results include: WBC unremarkable. Hbg unremarkable. Platelets unremarkable. Electrolytes unremarkable. BUN, creatinine unremarkable. ***  Imaging studies: I ordered imaging studies. I personally reviewed, interpreted imaging and agree with the radiologist's interpretations. The results include: ***   Problem list/ ED course/ Critical interventions/ Medical  management: HPI: See above Vital signs ***within normal range and stable throughout visit. Laboratory/imaging studies significant for: See above. On physical examination, patient is afebrile and appears in no acute distress. *** I have reviewed the patient home medicines and have made adjustments as needed.  Cardiac monitoring/EKG: The patient was maintained on a cardiac monitor.  I personally reviewed and interpreted the cardiac monitor which showed an underlying rhythm of: sinus rhythm.  Additional history obtained: External records from outside source obtained and reviewed including: Chart review including previous notes, labs, imaging.  Consultations obtained: I requested consultation with Dr. ***, and discussed lab and imaging findings as well as pertinent plan.  He/she ***.  Disposition Continued outpatient therapy. Follow-up with PCP*** recommended for reevaluation of symptoms. Treatment plan discussed with patient.  Pt acknowledged understanding was agreeable to the plan. Worrisome signs and symptoms were discussed with patient, and patient acknowledged understanding to return to the ED if they noticed these signs and symptoms. Patient was stable upon discharge.   This chart was dictated using voice recognition software.  Despite best efforts to proofread,  errors can occur which can change the documentation meaning.    {Document critical care time when appropriate:1} {Document review of labs and clinical decision tools ie heart score, Chads2Vasc2 etc:1}  {Document your independent review of radiology images, and any outside records:1} {Document your discussion with family members, caretakers, and with consultants:1} {Document social determinants of health affecting pt's care:1} {Document your decision making why or why not admission, treatments were needed:1} Final Clinical Impression(s) / ED Diagnoses Final diagnoses:  None    Rx / DC Orders ED Discharge Orders     None

## 2022-11-22 NOTE — Discharge Instructions (Signed)
Please take your antibiotics as prescribed. Take tylenol/ibuprofen every 6 hours for pain. I recommend close follow-up with PCP for reevaluation.  Please do not hesitate to return to emergency department if worrisome signs symptoms we discussed become apparent.  

## 2022-11-22 NOTE — ED Notes (Signed)
Pt reported that she needed to get something out of her car and has not returned after 10 minutes.

## 2022-11-22 NOTE — ED Triage Notes (Signed)
Pt c/o abd pain and N/V since this morning starting at 4 am. States she has no had a period last month or this month, states she does not know what exactly is going on

## 2022-11-25 LAB — GC/CHLAMYDIA PROBE AMP (~~LOC~~) NOT AT ARMC
Chlamydia: NEGATIVE
Comment: NEGATIVE
Comment: NORMAL
Neisseria Gonorrhea: NEGATIVE

## 2023-01-20 ENCOUNTER — Emergency Department (HOSPITAL_COMMUNITY)
Admission: EM | Admit: 2023-01-20 | Discharge: 2023-01-20 | Discharge status: Against Medical Advice | Payer: Self-pay | Source: Home / Self Care

## 2023-03-31 ENCOUNTER — Encounter: Payer: Self-pay | Admitting: Emergency Medicine

## 2023-03-31 ENCOUNTER — Emergency Department
Admission: EM | Admit: 2023-03-31 | Discharge: 2023-03-31 | Disposition: A | Discharge status: Home / Self Care | Payer: Self-pay | Attending: Emergency Medicine | Admitting: Emergency Medicine

## 2023-03-31 ENCOUNTER — Emergency Department: Payer: Self-pay

## 2023-03-31 DIAGNOSIS — X501XXA Overexertion from prolonged static or awkward postures, initial encounter: Secondary | ICD-10-CM | POA: Insufficient documentation

## 2023-03-31 DIAGNOSIS — S8992XA Unspecified injury of left lower leg, initial encounter: Secondary | ICD-10-CM | POA: Insufficient documentation

## 2023-03-31 NOTE — ED Triage Notes (Signed)
Pt arrived via [facility] from her workplace where she had her left knee get caught in a wheelchair and felt it pop out of joint and then when pt fell onto floor, knee popped back in joint. Pt with no hx/o dislocation of same knee. Pt denies LOC and denies hitting her head. No obvious deformity. Knee Splint applied on arrival to ensure placement maintained.

## 2023-03-31 NOTE — Discharge Instructions (Addendum)
Please use ibuprofen (Motrin) up to 800 mg every 8 hours, naproxen (Naprosyn) up to 500 mg every 12 hours, and/or acetaminophen (Tylenol) up to 4 g/day for any continued pain.  Please do not use this medication regimen for longer than 7 days

## 2023-03-31 NOTE — ED Provider Notes (Signed)
[facility] Provider Note   Event Date/Time   First MD Initiated Contact with Patient 03/31/23 2103     (approximate) History  Knee Injury  HPI Marissa Hicks is a 22 y.o. female with a stated past medical history of left knee dislocation who presents after getting her left foot caught in a wheelchair just prior to arrival causing a rotational force on the knee and patient stating she felt a pop as well as deformity to this left knee before going to the ground and feeling a pop in the deformity resolving.  Patient states that this is similar symptoms to when she has dislocated her knee in the past.  Patient states that she did not have to have surgery for the symptoms in the past and her pain is minimal at this time at approximately 3/10.  Patient has not taken any medications for this pain and patient states that she is afraid to ambulate on this knee given its instability.  Patient denies any numbness, tingling, or paresthesias distally ROS: Patient currently denies any vision changes, tinnitus, difficulty speaking, facial droop, sore throat, chest pain, shortness of breath, abdominal pain, nausea/vomiting/diarrhea, dysuria, or weakness/numbness/paresthesias in any extremity   Physical Exam  Triage Vital Signs: ED Triage Vitals  Encounter Vitals Group     BP 03/31/23 2021 136/79     Systolic BP Percentile --      Diastolic BP Percentile --      Pulse Rate 03/31/23 2021 91     Resp 03/31/23 2021 18     Temp 03/31/23 2021 98.3 F (36.8 C)     Temp Source 03/31/23 2021 Oral     SpO2 03/31/23 2021 98 %     Weight 03/31/23 1920 169 lb 12.1 oz (77 kg)     Height 03/31/23 1920 5\' 5"  (1.651 m)     Head Circumference --      Peak Flow --      Pain Score 03/31/23 1920 9     Pain Loc --      Pain Education --      Exclude from Growth Chart --    Most recent vital signs: Vitals:   03/31/23 2021  BP: 136/79  Pulse: 91  Resp: 18  Temp: 98.3 F (36.8 C)   SpO2: 98%   General: Awake, oriented x4. CV:  Good peripheral perfusion. Resp:  Normal effort. Abd:  No distention. Other:  Young adult overweight African-American female resting comfortably in no acute distress.  Left knee in knee immobilizer.  Distally neurovascularly intact ED Results / Procedures / Treatments  Labs (all labs ordered are listed, but only abnormal results are displayed) Labs Reviewed  POC URINE PREG, ED   RADIOLOGY ED MD interpretation: X-ray of the left knee interpreted independently by me and shows no evidence of acute abnormalities -Agree with radiology assessment Official radiology report(s): No results found. PROCEDURES: Critical Care performed: No Procedures MEDICATIONS ORDERED IN ED: Medications - No data to display IMPRESSION / MDM / ASSESSMENT AND PLAN / ED COURSE  I reviewed the triage vital signs and the nursing notes.                             The patient is on the cardiac monitor to evaluate for evidence of arrhythmia and/or significant heart rate changes. Patient's presentation is most consistent with acute presentation with potential threat to life or bodily function. 22 year old  female presents after feeling a pop in her left knee with history of left knee dislocation. Given history, exam and workup I have low suspicion for fracture, dislocation, significant ligamentous injury, septic arthritis, gout flare, new autoimmune arthropathy, or gonococcal arthropathy.  Interventions: Left knee x-ray shows no evidence of dislocation at this time  Patient kept in knee immobilizer and instructed to use crutches until follow-up with orthopedic surgery Disposition: Discharge home with strict return precautions and instructions for prompt primary care follow up in the next week.   FINAL CLINICAL IMPRESSION(S) / ED DIAGNOSES   Final diagnoses:  Injury of left knee, initial encounter   Rx / DC Orders   ED Discharge Orders     None      Note:   This document was prepared using Dragon voice recognition software and may include unintentional dictation errors.   Merwyn Katos, MD 03/31/23 2123

## 2023-03-31 NOTE — ED Notes (Signed)
Pt to ED for WC injury. Pt brought paperwork with pt and company information but no information on the paper about what testing was needed for WC. Pt was given the ineligibility form and instructed to give the form to her employer.

## 2023-03-31 NOTE — ED Notes (Signed)
Pt verbalizes understanding of discharge instructions. Opportunity for questioning and answers were provided. Pt discharged from ED to home.   ? ?

## 2023-05-12 ENCOUNTER — Emergency Department
Admission: EM | Admit: 2023-05-12 | Discharge: 2023-05-12 | Disposition: A | Discharge status: Home / Self Care | Payer: Self-pay | Attending: Emergency Medicine | Admitting: Emergency Medicine

## 2023-05-12 ENCOUNTER — Emergency Department: Payer: Self-pay

## 2023-05-12 ENCOUNTER — Other Ambulatory Visit: Payer: Self-pay

## 2023-05-12 DIAGNOSIS — R110 Nausea: Secondary | ICD-10-CM | POA: Insufficient documentation

## 2023-05-12 DIAGNOSIS — R1030 Lower abdominal pain, unspecified: Secondary | ICD-10-CM | POA: Insufficient documentation

## 2023-05-12 DIAGNOSIS — R102 Pelvic and perineal pain: Secondary | ICD-10-CM | POA: Insufficient documentation

## 2023-05-12 LAB — URINALYSIS, ROUTINE W REFLEX MICROSCOPIC
Bilirubin Urine: NEGATIVE
Glucose, UA: NEGATIVE mg/dL
Hgb urine dipstick: NEGATIVE
Ketones, ur: NEGATIVE mg/dL
Leukocytes,Ua: NEGATIVE
Nitrite: NEGATIVE
Protein, ur: NEGATIVE mg/dL
Specific Gravity, Urine: 1.017 (ref 1.005–1.030)
pH: 8 (ref 5.0–8.0)

## 2023-05-12 LAB — BASIC METABOLIC PANEL
Anion gap: 8 (ref 5–15)
BUN: 11 mg/dL (ref 6–20)
CO2: 24 mmol/L (ref 22–32)
Calcium: 9 mg/dL (ref 8.9–10.3)
Chloride: 105 mmol/L (ref 98–111)
Creatinine, Ser: 0.84 mg/dL (ref 0.44–1.00)
GFR, Estimated: >60 mL/min (ref >60)
Glucose, Bld: 95 mg/dL (ref 70–99)
Potassium: 3.2 mmol/L — ABNORMAL LOW (ref 3.5–5.1)
Sodium: 137 mmol/L (ref 135–145)

## 2023-05-12 LAB — POC URINE PREG, ED: Preg Test, Ur: NEGATIVE

## 2023-05-12 LAB — CBC
HCT: 38.7 % (ref 36.0–46.0)
Hemoglobin: 12.4 g/dL (ref 12.0–15.0)
MCH: 26 pg (ref 26.0–34.0)
MCHC: 32 g/dL (ref 30.0–36.0)
MCV: 81.1 fL (ref 80.0–100.0)
Platelets: 378 10*3/uL (ref 150–400)
RBC: 4.77 MIL/uL (ref 3.87–5.11)
RDW: 15.6 % — ABNORMAL HIGH (ref 11.5–15.5)
WBC: 5.9 10*3/uL (ref 4.0–10.5)
nRBC: 0 % (ref 0.0–0.2)

## 2023-05-12 LAB — HCG, QUANTITATIVE, PREGNANCY: hCG, Beta Chain, Quant, S: <1 m[IU]/mL (ref <5)

## 2023-05-12 MED ORDER — ONDANSETRON 4 MG PO TBDP
4.0000 mg | ORAL_TABLET | Freq: Once | ORAL | Status: DC
  Filled 2023-05-12: qty 1

## 2023-05-12 NOTE — ED Provider Notes (Signed)
[facility] Emergency Department Provider Note     Event Date/Time   First MD Initiated Contact with Patient 05/12/23 1123     (approximate)   History   Pelvic Pain   HPI  Marissa Hicks is a 22 y.o. female with no significant past medical history presents for evaluation of pelvic pain since last night. Pain is localized to central suprapubic area. Patient reports pain was noticed after intercourse with her partner and has since persisted.  Associated symptoms includes nausea. Denies vaginal bleeding, vaginal itching, injury, dysuria, fever and vomiting. Reports LMP 04/08/23.    Physical Exam   Triage Vital Signs: ED Triage Vitals  Encounter Vitals Group     BP 05/12/23 1055 (!) 144/97     Systolic BP Percentile --      Diastolic BP Percentile --      Pulse Rate 05/12/23 1055 78     Resp 05/12/23 1055 16     Temp 05/12/23 1055 98.4 F (36.9 C)     Temp Source 05/12/23 1055 Oral     SpO2 05/12/23 1055 100 %     Weight 05/12/23 1056 199 lb 15.3 oz (90.7 kg)     Height 05/12/23 1056 5\' 5"  (1.651 m)     Head Circumference --      Peak Flow --      Pain Score 05/12/23 1056 6     Pain Loc --      Pain Education --      Exclude from Growth Chart --     Most recent vital signs: Vitals:   05/12/23 1055  BP: (!) 144/97  Pulse: 78  Resp: 16  Temp: 98.4 F (36.9 C)  SpO2: 100%    General Awake, no distress. Well appearing. HEENT NCAT. PERRL. EOMI. No rhinorrhea. Mucous membranes are moist. CV:  Good peripheral perfusion. RRR RESP:  Normal effort. LCTAB ABD:  No distention. Soft. No mass or organomegaly. Tenderness over suprapubic region.   ED Results / Procedures / Treatments   Labs (all labs ordered are listed, but only abnormal results are displayed) Labs Reviewed  CBC - Abnormal; Notable for the following components:      Result Value   RDW 15.6 (*)    All other components within normal limits  BASIC METABOLIC PANEL -  Abnormal; Notable for the following components:   Potassium 3.2 (*)    All other components within normal limits  URINALYSIS, ROUTINE W REFLEX MICROSCOPIC - Abnormal; Notable for the following components:   Color, Urine YELLOW (*)    APPearance CLOUDY (*)    All other components within normal limits  HCG, QUANTITATIVE, PREGNANCY  POC URINE PREG, ED   No results found.  PROCEDURES:  Critical Care performed: No  Procedures   MEDICATIONS ORDERED IN ED: Medications  ondansetron (ZOFRAN-ODT) disintegrating tablet 4 mg (has no administration in time range)     IMPRESSION / MDM / ASSESSMENT AND PLAN / ED COURSE  I reviewed the triage vital signs and the nursing notes.                              Clinical Course as of 05/12/23 1330  Mon May 12, 2023  1321 Inform patient eloped the ED.  Ultrasound presented to room and patient was not there.  Patient reported to nurse she was going to her car to get a Consulting civil engineer and has not  returned. [MH]    Clinical Course User Index [MH] Kern Reap A, PA-C    22 y.o. female presents to the emergency department for evaluation and treatment of acute pelvic pain. See HPI for further details.   Differential diagnosis includes, but is not limited to pregnancy, ovarian torsion, ectopic, UTI  Patient's presentation is most consistent with acute complicated illness / injury requiring diagnostic workup.  Patient is alert and oriented she is hemodynamically stable and afebrile.  She is in no acute distress.  Physical exam findings are overall benign.  Pertinent for centralized prepubic tenderness on palpation.  Lab work is reassuring urinalysis without UTI and patient is not pregnant.  Ultrasound ordered to rule out ovarian torsion given patient's acute presentation of suprapubic pain and nausea however patient eloped from ED room when ultrasound arrived.  She was in stable condition prior to elopement.  FINAL CLINICAL IMPRESSION(S) / ED DIAGNOSES    Final diagnoses:  Pelvic pain   Rx / DC Orders   ED Discharge Orders     None        Note:  This document was prepared using Dragon voice recognition software and may include unintentional dictation errors.    Kern Reap A, PA-C 05/12/23 1330    Jene Every, MD 05/12/23 1406

## 2023-05-12 NOTE — ED Triage Notes (Signed)
Pt here with pelvic pain since last night. Pt states she has not had her menstrual since 04/08/23. Pt has taken several pregnancy tests that were all negative. Pt having nausea. Pt denies fever, cp, or sob.

## 2023-05-12 NOTE — ED Notes (Signed)
Patient is in ultrasound. Will medicate when she is back.

## 2023-05-23 ENCOUNTER — Emergency Department: Payer: Self-pay

## 2023-05-23 ENCOUNTER — Other Ambulatory Visit: Payer: Self-pay

## 2023-05-23 ENCOUNTER — Encounter: Payer: Self-pay | Admitting: Emergency Medicine

## 2023-05-23 ENCOUNTER — Emergency Department
Admission: EM | Admit: 2023-05-23 | Discharge: 2023-05-23 | Disposition: A | Discharge status: Home / Self Care | Payer: Self-pay | Attending: Emergency Medicine | Admitting: Emergency Medicine

## 2023-05-23 DIAGNOSIS — N760 Acute vaginitis: Secondary | ICD-10-CM | POA: Insufficient documentation

## 2023-05-23 DIAGNOSIS — Z91040 Latex allergy status: Secondary | ICD-10-CM | POA: Insufficient documentation

## 2023-05-23 DIAGNOSIS — B9689 Other specified bacterial agents as the cause of diseases classified elsewhere: Secondary | ICD-10-CM | POA: Insufficient documentation

## 2023-05-23 DIAGNOSIS — R102 Pelvic and perineal pain: Secondary | ICD-10-CM

## 2023-05-23 DIAGNOSIS — E282 Polycystic ovarian syndrome: Secondary | ICD-10-CM

## 2023-05-23 LAB — WET PREP, GENITAL
Sperm: NONE SEEN
Trich, Wet Prep: NONE SEEN
WBC, Wet Prep HPF POC: <10 (ref <10)
Yeast Wet Prep HPF POC: NONE SEEN

## 2023-05-23 LAB — CBC WITH DIFFERENTIAL/PLATELET
Abs Immature Granulocytes: 0.06 10*3/uL (ref 0.00–0.07)
Basophils Absolute: 0.1 10*3/uL (ref 0.0–0.1)
Basophils Relative: 1 %
Eosinophils Absolute: 0.3 10*3/uL (ref 0.0–0.5)
Eosinophils Relative: 3 %
HCT: 38.9 % (ref 36.0–46.0)
Hemoglobin: 12.6 g/dL (ref 12.0–15.0)
Immature Granulocytes: 1 %
Lymphocytes Relative: 26 %
Lymphs Abs: 2.5 10*3/uL (ref 0.7–4.0)
MCH: 26.9 pg (ref 26.0–34.0)
MCHC: 32.4 g/dL (ref 30.0–36.0)
MCV: 83.1 fL (ref 80.0–100.0)
Monocytes Absolute: 1 10*3/uL (ref 0.1–1.0)
Monocytes Relative: 10 %
Neutro Abs: 5.7 10*3/uL (ref 1.7–7.7)
Neutrophils Relative %: 59 %
Platelets: 377 10*3/uL (ref 150–400)
RBC: 4.68 MIL/uL (ref 3.87–5.11)
RDW: 15.7 % — ABNORMAL HIGH (ref 11.5–15.5)
WBC: 9.6 10*3/uL (ref 4.0–10.5)
nRBC: 0 % (ref 0.0–0.2)

## 2023-05-23 LAB — URINALYSIS, ROUTINE W REFLEX MICROSCOPIC
Bacteria, UA: NONE SEEN
Bilirubin Urine: NEGATIVE
Glucose, UA: NEGATIVE mg/dL
Ketones, ur: NEGATIVE mg/dL
Leukocytes,Ua: NEGATIVE
Nitrite: NEGATIVE
Protein, ur: NEGATIVE mg/dL
Specific Gravity, Urine: 1.01 (ref 1.005–1.030)
pH: 8 (ref 5.0–8.0)

## 2023-05-23 LAB — COMPREHENSIVE METABOLIC PANEL
ALT: 28 U/L (ref 0–44)
AST: 21 U/L (ref 15–41)
Albumin: 3.7 g/dL (ref 3.5–5.0)
Alkaline Phosphatase: 82 U/L (ref 38–126)
Anion gap: 6 (ref 5–15)
BUN: 10 mg/dL (ref 6–20)
CO2: 24 mmol/L (ref 22–32)
Calcium: 8.9 mg/dL (ref 8.9–10.3)
Chloride: 105 mmol/L (ref 98–111)
Creatinine, Ser: 0.66 mg/dL (ref 0.44–1.00)
GFR, Estimated: >60 mL/min (ref >60)
Glucose, Bld: 120 mg/dL — ABNORMAL HIGH (ref 70–99)
Potassium: 3.9 mmol/L (ref 3.5–5.1)
Sodium: 135 mmol/L (ref 135–145)
Total Bilirubin: 0.7 mg/dL (ref <1.2)
Total Protein: 7.2 g/dL (ref 6.5–8.1)

## 2023-05-23 LAB — CHLAMYDIA/NGC RT PCR (ARMC ONLY)
Chlamydia Tr: NOT DETECTED
N gonorrhoeae: NOT DETECTED

## 2023-05-23 LAB — LIPASE, BLOOD: Lipase: 35 U/L (ref 11–51)

## 2023-05-23 LAB — POC URINE PREG, ED: Preg Test, Ur: NEGATIVE

## 2023-05-23 MED ORDER — HYDROCODONE-ACETAMINOPHEN 5-325 MG PO TABS: 1.0000 | ORAL_TABLET | Freq: Four times a day (QID) | ORAL | 0 refills | Status: DC | PRN

## 2023-05-23 MED ORDER — METRONIDAZOLE 500 MG PO TABS
500.0000 mg | ORAL_TABLET | Freq: Once | ORAL | Status: AC
Start: 2023-05-23 — End: 2023-05-23
  Administered 2023-05-23: 500 mg via ORAL
  Filled 2023-05-23: qty 1

## 2023-05-23 MED ORDER — METRONIDAZOLE 500 MG PO TABS: 500.0000 mg | ORAL_TABLET | Freq: Two times a day (BID) | ORAL | 0 refills | Status: DC

## 2023-05-23 MED ORDER — KETOROLAC TROMETHAMINE 30 MG/ML IJ SOLN
15.0000 mg | Freq: Once | INTRAMUSCULAR | Status: AC
  Administered 2023-05-23: 15 mg via INTRAVENOUS
  Filled 2023-05-23: qty 1

## 2023-05-23 NOTE — ED Notes (Signed)
 Patient taken for [place] at this time.

## 2023-05-23 NOTE — ED Provider Notes (Signed)
[facility] Provider Note    Event Date/Time   First MD Initiated Contact with Patient 05/23/23 [phone]     (approximate)   History   Abdominal Pain   HPI  Marissa Hicks is a 22 y.o. female who presents to the ED from home with a 1 day history of midline pelvic pain.  Seen in the ED for same 05/12/2023 but left prior to ultrasound.  Denies associated fever/chills, chest pain, shortness of breath, nausea, vomiting, dysuria, vaginal discharge/bleeding or diarrhea.  Last sexual intercourse 2 weeks ago.     Past Medical History  History reviewed. No pertinent past medical history.   Active Problem List   Patient Active Problem List   Diagnosis Date Noted   Activities involving running 03/02/2014   Pain in joint, lower leg 03/02/2014   Muscle weakness (generalized) 03/02/2014     Past Surgical History  History reviewed. No pertinent surgical history.   Home Medications   Prior to Admission medications   Medication Sig Start Date End Date Taking? Authorizing Provider  metoCLOPramide (REGLAN) 10 MG tablet Take 1 tablet (10 mg total) by mouth every 6 (six) hours. Patient not taking: Reported on 07/05/2021 06/23/21   Eber Hong, MD  metroNIDAZOLE (FLAGYL) 500 MG tablet Take 1 tablet (500 mg total) by mouth 2 (two) times daily. 11/22/22   Jeanelle Malling, PA     Allergies  Latex   Family History   Family History  Problem Relation Age of Onset   Diabetes Maternal Grandmother    Hypertension Maternal Grandmother    Hypertension Maternal Grandfather      Physical Exam  Triage Vital Signs: ED Triage Vitals  Encounter Vitals Group     BP 05/23/23 0337 (!) 137/91     Systolic BP Percentile --      Diastolic BP Percentile --      Pulse Rate 05/23/23 0337 86     Resp 05/23/23 0337 16     Temp 05/23/23 0337 98 F (36.7 C)     Temp Source 05/23/23 0337 Oral     SpO2 05/23/23 0337 99 %     Weight 05/23/23 0336 100 lb (45.4 kg)     Height  05/23/23 0336 5\' 5"  (1.651 m)     Head Circumference --      Peak Flow --      Pain Score 05/23/23 0336 8     Pain Loc --      Pain Education --      Exclude from Growth Chart --     Updated Vital Signs: BP (!) 137/91   Pulse 86   Temp 98 F (36.7 C) (Oral)   Resp 16   Ht 5\' 5"  (1.651 m)   Wt 45.4 kg   LMP 04/08/2023   SpO2 99%   BMI 16.64 kg/m    General: Awake, no distress.  CV:  RRR.  Good peripheral perfusion.  Resp:  Normal effort.  CTAB. Abd:  Minimal suprapubic tenderness to palpation without rebound or guarding.  No distention.  Other:  No truncal vesicles.   ED Results / Procedures / Treatments  Labs (all labs ordered are listed, but only abnormal results are displayed) Labs Reviewed  WET PREP, GENITAL - Abnormal; Notable for the following components:      Result Value   Clue Cells Wet Prep HPF POC PRESENT (*)    All other components within normal limits  URINALYSIS, ROUTINE W REFLEX MICROSCOPIC -  Abnormal; Notable for the following components:   Color, Urine STRAW (*)    APPearance CLEAR (*)    Hgb urine dipstick MODERATE (*)    All other components within normal limits  CBC WITH DIFFERENTIAL/PLATELET - Abnormal; Notable for the following components:   RDW 15.7 (*)    All other components within normal limits  COMPREHENSIVE METABOLIC PANEL - Abnormal; Notable for the following components:   Glucose, Bld 120 (*)    All other components within normal limits  CHLAMYDIA/NGC RT PCR ([facility] ONLY)            LIPASE, BLOOD  POC URINE PREG, ED     EKG  None   RADIOLOGY I have independently visualized and interpreted patient's imaging study as well as noted the radiology interpretation:  Ultrasound: Trace free fluid, likely physiologic; multiple small follicles in both ovaries, possible PCOS  Official radiology report(s): US PELVIC COMPLETE W TRANSVAGINAL AND TORSION R/O Result Date: 05/23/2023 CLINICAL DATA:  22 year old female with history of pelvic  cramping. EXAM: TRANSABDOMINAL AND TRANSVAGINAL ULTRASOUND OF PELVIS DOPPLER ULTRASOUND OF OVARIES TECHNIQUE: Both transabdominal and transvaginal ultrasound examinations of the pelvis were performed. Transabdominal technique was performed for global imaging of the pelvis including uterus, ovaries, adnexal regions, and pelvic cul-de-sac. It was necessary to proceed with endovaginal exam following the transabdominal exam to visualize the adnexal regions. Color and duplex Doppler ultrasound was utilized to evaluate blood flow to the ovaries. COMPARISON:  Pelvic ultrasound 03/11/2022. FINDINGS: Uterus Measurements: 7.5 x 3.9 x 5.2 cm = volume: 80 mL. No fibroids or other mass visualized. Endometrium Thickness: 11.9 mm.  No focal abnormality visualized. Right ovary Measurements: 4.1 x 2.3 x 2.4 cm = volume: 11.5 mL. Normal appearance/no adnexal mass. Multiple small follicles. Left ovary Measurements: 3.7 x 2.0 x 2.5 cm = volume: 9.9 mL. Normal appearance/no adnexal mass. Multiple small follicles, greater than typically seen in terms of number. Pulsed Doppler evaluation of both ovaries demonstrates normal low-resistance arterial and venous waveforms. Other findings Trace volume of free fluid in the cul-de-sac. IMPRESSION: 1. Trace amount of free fluid in the cul-de-sac, likely physiologic in this young female patient. No other acute findings to account for the patient's symptoms. 2. Multiple small follicles in both ovaries, particularly on the left. The possibility of polycystic ovarian syndrome should be considered. Further nonemergent outpatient clinical evaluation is recommended. Electronically Signed   By: Trudie Reed M.D.   On: 05/23/2023 05:21     PROCEDURES:  Critical Care performed: No  Procedures   MEDICATIONS ORDERED IN ED: Medications  ketorolac (TORADOL) 30 MG/ML injection 15 mg (15 mg Intravenous Given 05/23/23 0358)     IMPRESSION / MDM / ASSESSMENT AND PLAN / ED COURSE  I reviewed  the triage vital signs and the nursing notes.                             22 year old female presenting with pelvic pain. Differential diagnosis includes, but is not limited to, ovarian cyst, ovarian torsion, acute appendicitis, diverticulitis, urinary tract infection/pyelonephritis, endometriosis, bowel obstruction, colitis, renal colic, gastroenteritis, hernia, fibroids, endometriosis, pregnancy related pain including ectopic pregnancy, etc. I personally reviewed patient's records and note her ED visit from 05/12/2023.  Patient's presentation is most consistent with acute complicated illness / injury requiring diagnostic workup.  Will obtain lab work, UA.  POCT is negative.  Administer ketorolac and proceed with pelvic ultrasound.  Patient will self swab for  STI.  Clinical Course as of 05/23/23 0529  Fri May 23, 2023  1610 Updated patient on laboratory and ultrasound results.  She will check MyChart for the results of her STD test.  Will start Flagyl, prescribe Norco as needed and refer patient to GYN for outpatient follow-up for further evaluation of possible PCOS.  Strict return precautions given.  Patient verbalizes understanding and agrees with plan of care. [JS]    Clinical Course User Index [JS] Irean Hong, MD     FINAL CLINICAL IMPRESSION(S) / ED DIAGNOSES   Final diagnoses:  Pelvic pain in female  Bacterial vaginosis     Rx / DC Orders   ED Discharge Orders     None        Note:  This document was prepared using Dragon voice recognition software and may include unintentional dictation errors.   Irean Hong, MD 05/23/23 [phone]

## 2023-05-23 NOTE — Discharge Instructions (Addendum)
You may take Ibuprofen as needed for pain; Norco as needed for more severe pain.  Take and finish antibiotic as prescribed.  You may check the results of STD testing in MyChart.  Return to the ER for worsening symptoms, persistent vomiting, fever or other concerns.

## 2023-05-23 NOTE — ED Triage Notes (Signed)
Patient ambulatory to triage with steady gait, without difficulty or distress noted; pt reports mid lower abd pain tonight with no accomp symptoms; st here recently for same but left before being seen

## 2023-06-03 ENCOUNTER — Emergency Department
Admission: EM | Admit: 2023-06-03 | Discharge: 2023-06-03 | Disposition: A | Discharge status: Home / Self Care | Payer: Self-pay | Source: Home / Self Care

## 2023-06-16 ENCOUNTER — Encounter: Payer: Self-pay | Admitting: Advanced Practice Midwife

## 2023-06-25 ENCOUNTER — Encounter: Payer: Self-pay | Admitting: Advanced Practice Midwife

## 2023-07-02 ENCOUNTER — Emergency Department: Payer: Self-pay

## 2023-07-02 ENCOUNTER — Other Ambulatory Visit: Payer: Self-pay

## 2023-07-02 ENCOUNTER — Emergency Department
Admission: EM | Admit: 2023-07-02 | Discharge: 2023-07-02 | Disposition: A | Discharge status: Home / Self Care | Payer: Self-pay | Attending: Emergency Medicine | Admitting: Emergency Medicine

## 2023-07-02 DIAGNOSIS — R1031 Right lower quadrant pain: Secondary | ICD-10-CM | POA: Insufficient documentation

## 2023-07-02 DIAGNOSIS — R112 Nausea with vomiting, unspecified: Secondary | ICD-10-CM | POA: Insufficient documentation

## 2023-07-02 LAB — CBC
HCT: 39.8 % (ref 36.0–46.0)
Hemoglobin: 12.7 g/dL (ref 12.0–15.0)
MCH: 26.2 pg (ref 26.0–34.0)
MCHC: 31.9 g/dL (ref 30.0–36.0)
MCV: 82.2 fL (ref 80.0–100.0)
Platelets: 394 10*3/uL (ref 150–400)
RBC: 4.84 MIL/uL (ref 3.87–5.11)
RDW: 15.2 % (ref 11.5–15.5)
WBC: 8.2 10*3/uL (ref 4.0–10.5)
nRBC: 0 % (ref 0.0–0.2)

## 2023-07-02 LAB — COMPREHENSIVE METABOLIC PANEL
ALT: 34 U/L (ref 0–44)
AST: 24 U/L (ref 15–41)
Albumin: 4.1 g/dL (ref 3.5–5.0)
Alkaline Phosphatase: 89 U/L (ref 38–126)
Anion gap: 8 (ref 5–15)
BUN: 14 mg/dL (ref 6–20)
CO2: 23 mmol/L (ref 22–32)
Calcium: 9 mg/dL (ref 8.9–10.3)
Chloride: 105 mmol/L (ref 98–111)
Creatinine, Ser: 0.76 mg/dL (ref 0.44–1.00)
GFR, Estimated: >60 mL/min (ref >60)
Glucose, Bld: 119 mg/dL — ABNORMAL HIGH (ref 70–99)
Potassium: 3.8 mmol/L (ref 3.5–5.1)
Sodium: 136 mmol/L (ref 135–145)
Total Bilirubin: 0.4 mg/dL (ref 0.0–1.2)
Total Protein: 7.6 g/dL (ref 6.5–8.1)

## 2023-07-02 LAB — URINALYSIS, ROUTINE W REFLEX MICROSCOPIC
Bilirubin Urine: NEGATIVE
Glucose, UA: NEGATIVE mg/dL
Hgb urine dipstick: NEGATIVE
Ketones, ur: NEGATIVE mg/dL
Leukocytes,Ua: NEGATIVE
Nitrite: NEGATIVE
Protein, ur: NEGATIVE mg/dL
Specific Gravity, Urine: 1.024 (ref 1.005–1.030)
pH: 6 (ref 5.0–8.0)

## 2023-07-02 LAB — POC URINE PREG, ED: Preg Test, Ur: NEGATIVE

## 2023-07-02 LAB — LIPASE, BLOOD: Lipase: 36 U/L (ref 11–51)

## 2023-07-02 MED ORDER — SODIUM CHLORIDE 0.9 % IV BOLUS (SEPSIS)
1000.0000 mL | Freq: Once | INTRAVENOUS | Status: AC
  Administered 2023-07-02: 1000 mL via INTRAVENOUS

## 2023-07-02 MED ORDER — ONDANSETRON HCL 4 MG/2ML IJ SOLN
4.0000 mg | Freq: Once | INTRAMUSCULAR | Status: AC
  Administered 2023-07-02: 4 mg via INTRAVENOUS

## 2023-07-02 MED ORDER — IOHEXOL 300 MG/ML  SOLN
80.0000 mL | Freq: Once | INTRAMUSCULAR | Status: AC | PRN
  Administered 2023-07-02: 80 mL via INTRAVENOUS

## 2023-07-02 NOTE — ED Provider Notes (Addendum)
[facility] Provider Note    Event Date/Time   First MD Initiated Contact with Patient 07/02/23 [phone]     (approximate)   History   Abdominal Pain   HPI  Marissa Hicks is a 23 y.o. female with no significant past medical history who presents to the emergency department with suprapubic and right lower quadrant abdominal pain with nausea and vomiting that started today.  No fevers, diarrhea, vaginal bleeding or discharge, dysuria or hematuria.  States she took a pregnancy test at home and thought she saw a faint positive.  LMP was 05/23/2023.  No previous abdominal surgery.  No sick contact.   History provided by patient.    History reviewed. No pertinent past medical history.  History reviewed. No pertinent surgical history.  MEDICATIONS:  Prior to Admission medications   Medication Sig Start Date End Date Taking? Authorizing Provider  HYDROcodone-acetaminophen (NORCO) 5-325 MG tablet Take 1 tablet by mouth every 6 (six) hours as needed for moderate pain (pain score 4-6). 05/23/23   Irean Hong, MD  metoCLOPramide (REGLAN) 10 MG tablet Take 1 tablet (10 mg total) by mouth every 6 (six) hours. Patient not taking: Reported on 07/05/2021 06/23/21   Eber Hong, MD  metroNIDAZOLE (FLAGYL) 500 MG tablet Take 1 tablet (500 mg total) by mouth 2 (two) times daily. 05/23/23   Irean Hong, MD    Physical Exam   Triage Vital Signs: ED Triage Vitals [07/02/23 0503]  Encounter Vitals Group     BP (!) 145/91     Systolic BP Percentile      Diastolic BP Percentile      Pulse Rate 92     Resp 18     Temp 98 F (36.7 C)     Temp Source Oral     SpO2 100 %     Weight      Height      Head Circumference      Peak Flow      Pain Score      Pain Loc      Pain Education      Exclude from Growth Chart     Most recent vital signs: Vitals:   07/02/23 0503  BP: (!) 145/91  Pulse: 92  Resp: 18  Temp: 98 F (36.7 C)  SpO2: 100%     CONSTITUTIONAL: Alert, responds appropriately to questions. Well-appearing; well-nourished HEAD: Normocephalic, atraumatic EYES: Conjunctivae clear, pupils appear equal, sclera nonicteric ENT: normal nose; moist mucous membranes NECK: Supple, normal ROM CARD: RRR; S1 and S2 appreciated RESP: Normal chest excursion without splinting or tachypnea; breath sounds clear and equal bilaterally; no wheezes, no rhonchi, no rales, no hypoxia or respiratory distress, speaking full sentences ABD/GI: Non-distended; soft, tender at McBurney's point, no guarding or rebound BACK: The back appears normal EXT: Normal ROM in all joints; no deformity noted, no edema SKIN: Normal color for age and race; warm; no rash on exposed skin NEURO: Moves all extremities equally, normal speech PSYCH: The patient's mood and manner are appropriate.   ED Results / Procedures / Treatments   LABS: (all labs ordered are listed, but only abnormal results are displayed) Labs Reviewed  COMPREHENSIVE METABOLIC PANEL - Abnormal; Notable for the following components:      Result Value   Glucose, Bld 119 (*)    All other components within normal limits  URINALYSIS, ROUTINE W REFLEX MICROSCOPIC - Abnormal; Notable for the following components:   Color, Urine  YELLOW (*)    APPearance CLEAR (*)    All other components within normal limits  CBC  LIPASE, BLOOD  POC URINE PREG, ED     EKG:   RADIOLOGY: My personal review and interpretation of imaging: CT scan shows normal adnexa.  No appendicitis.  I have personally reviewed all radiology reports.   CT ABDOMEN PELVIS W CONTRAST Result Date: 07/02/2023 CLINICAL DATA:  Right lower quadrant pain.  Nausea and vomiting. EXAM: CT ABDOMEN AND PELVIS WITH CONTRAST TECHNIQUE: Multidetector CT imaging of the abdomen and pelvis was performed using the standard protocol following bolus administration of intravenous contrast. RADIATION DOSE REDUCTION: This exam was performed  according to the departmental dose-optimization program which includes automated exposure control, adjustment of the mA and/or kV according to patient size and/or use of iterative reconstruction technique. CONTRAST:  80mL OMNIPAQUE IOHEXOL 300 MG/ML  SOLN COMPARISON:  CT urogram 10/29/2018 FINDINGS: Lower chest: Unremarkable. Hepatobiliary: No suspicious focal abnormality within the liver parenchyma. Small area of low attenuation in the anterior liver, adjacent to the falciform ligament, is in a characteristic location for focal fatty deposition. Gallbladder is decompressed. No intrahepatic or extrahepatic biliary dilation. Pancreas: No focal mass lesion. No dilatation of the main duct. No intraparenchymal cyst. No peripancreatic edema. Spleen: No splenomegaly. No suspicious focal mass lesion. Adrenals/Urinary Tract: No adrenal nodule or mass. There are numerous lesions in both kidneys of varying size and attenuation. 1 of the more dominant lesions in the right kidney measures 13 mm with attenuation slightly higher than would be expected for simple fluid. 1 of the more dominant lesions in the left kidney is also 12 mm with attenuation higher than would be expected for simple fluid. These are more conspicuous today than on the prior study given the administration of intravenous contrast material. The majority of these lesions are much too small to characterize by CT but are statistically most likely benign given patient age. No evidence for hydroureter. The urinary bladder appears normal for the degree of distention. Stomach/Bowel: Stomach is unremarkable. No gastric wall thickening. No evidence of outlet obstruction. Duodenum is normally positioned as is the ligament of Treitz. No small bowel wall thickening. No small bowel dilatation. The terminal ileum is normal. The appendix is normal. No gross colonic mass. No colonic wall thickening. Vascular/Lymphatic: No abdominal aortic aneurysm. No abdominal aortic  atherosclerotic calcification. There is no gastrohepatic or hepatoduodenal ligament lymphadenopathy. No retroperitoneal or mesenteric lymphadenopathy. No pelvic sidewall lymphadenopathy. Reproductive: The uterus is unremarkable.  There is no adnexal mass. Other: Trace free fluid identified in the cul-de-sac. Musculoskeletal: No worrisome lytic or sclerotic osseous abnormality. IMPRESSION: 1. No acute findings in the abdomen or pelvis. Specifically, no findings to explain the patient's history of right lower quadrant pain. Normal appendix. No adnexal mass. 2. Trace free fluid in the cul-de-sac, likely physiologic. 3. Numerous low-density lesions in both kidneys of varying size and attenuation. The majority of these lesions are much too small to characterize by CT but are statistically most likely benign given patient age. Correlation with renal function recommended and given the number of cystic lesions bilaterally, outpatient urology follow-up may be warranted. Electronically Signed   By: Kennith Center M.D.   On: 07/02/2023 06:46     PROCEDURES:  Critical Care performed: No     Procedures    IMPRESSION / MDM / ASSESSMENT AND PLAN / ED COURSE  I reviewed the triage vital signs and the nursing notes.    Patient here with right  lower quad abdominal pain, vomiting.  The patient is on the cardiac monitor to evaluate for evidence of arrhythmia and/or significant heart rate changes.   DIFFERENTIAL DIAGNOSIS (includes but not limited to):   Appendicitis, colitis, diverticulitis, viral gastroenteritis, UTI, kidney stone, pyelonephritis, ovarian torsion, ovarian cyst, PID   Patient's presentation is most consistent with acute presentation with potential threat to life or bodily function.   PLAN: Patient is tender at McBurney's point.  Will obtain CT abdomen pelvis here.  Will obtain abdominal labs, urine.  Urine pregnancy test is negative.  She declines any pain medication.  Will give IV fluids,  Zofran.   MEDICATIONS GIVEN IN ED: Medications  sodium chloride 0.9 % bolus 1,000 mL (0 mLs Intravenous Stopped 07/02/23 0637)  ondansetron (ZOFRAN) injection 4 mg (4 mg Intravenous Given 07/02/23 0532)  iohexol (OMNIPAQUE) 300 MG/ML solution 80 mL (80 mLs Intravenous Contrast Given 07/02/23 0626)     ED COURSE: Patient's labs show no leukocytosis, normal electrolytes, creatinine, LFTs.  Lipase pending but low suspicion for pancreatitis given no upper abdominal pain.  Urine shows no sign of infection.  CT scan reviewed and interpreted by myself and the radiologist.  Appendix is normal.  Adnexa are normal.  Patient does have multiple low-density lesions in both kidneys that are likely benign.  Outpatient urology follow-up recommended however when I went back to the room to talk to the patient, it appears she had eloped.  Patient removed her own IV and it was sitting on the bed.  It appears patient also eloped during one of her recent visits to the emergency department in December after she was brought back to a treatment room and was awaiting ultrasound.  I called the patient at the number listed in her chart to discuss these findings.  Patient did not answer the phone.  HIPAA compliant voicemail left.  7:50 AM  Pt called back.  Informed her of the bilateral renal lesions and need for urology follow-up.  Recommended calling [facility].  Dr. Lonna Cobb is currently on call.  Patient verbalized understanding and reports she will call to schedule an appointment.   CONSULTS:  none, patient eloped   OUTSIDE RECORDS REVIEWED: Patient has been seen in the emergency department in December 2024 for pelvic pain.  Had multiple small follicles seen on both ovaries and radiology felt that the possibility of PCOS should be considered.  GC and Chlamydia negative at that time.       FINAL CLINICAL IMPRESSION(S) / ED DIAGNOSES   Final diagnoses:  RLQ abdominal pain  Nausea and vomiting in adult      Rx / DC Orders   ED Discharge Orders     None        Note:  This document was prepared using Dragon voice recognition software and may include unintentional dictation errors.   Nakeeta Sebastiani, Layla Maw, DO 07/02/23 0725    Massey Ruhland, Layla Maw, DO 07/02/23 [phone]

## 2023-07-02 NOTE — ED Triage Notes (Addendum)
Pt states she developed n/v and abd pain tonight. Pt denies any dysuria, vaginal bleeding or discharge. Denies cough congestion or fever

## 2023-07-02 NOTE — ED Notes (Signed)
Pt in ct 

## 2023-07-02 NOTE — ED Notes (Signed)
Dr. Elesa Massed at bedside, pt is not found in the room. IV removed and placed on bed at this time. Pt Eloped.

## 2023-07-04 ENCOUNTER — Telehealth: Payer: Self-pay | Admitting: Family Medicine

## 2023-07-04 NOTE — Telephone Encounter (Signed)
Courtney-please see my message as well as her ER note-patient has appointment with you next week to become established thanks-Dr. Lorin Picket

## 2023-07-04 NOTE — Telephone Encounter (Signed)
This patient is not a patient currently at [facility]- was last seen in this office in 2018 and was a patient of a provider that has retired. Patient called to see if she would like office visit scheduled to establish care with ou new PA Courtney for an ER follow up  Left message to return call for patient

## 2023-07-04 NOTE — Telephone Encounter (Signed)
Nurses Patient recently in the ER for abdominal pain They did a CAT scan which did show some small cysts in the kidneys.  According to the report there were numerous cysts.  In the situations we recommend a follow-up office visit evaluation where we will check urine specimen plus also order ultrasound of the kidneys-(that is a better way of looking at renal cysts)  Then depending on that may or may not need consultation with urology  Please connect with patient to have her do a follow-up visit with myself or Toni Amend regarding this issue When that appointment is scheduled please make sure within the commentary it references this telephone message  If unable to reach patient by phone send her MyChart message with the above message relayed as well thank you  Portion of the CAT scan report below MPRESSION: 1. No acute findings in the abdomen or pelvis. Specifically, no findings to explain the patient's history of right lower quadrant pain. Normal appendix. No adnexal mass. 2. Trace free fluid in the cul-de-sac, likely physiologic. 3. Numerous low-density lesions in both kidneys of varying size and attenuation. The majority of these lesions are much too small to characterize by CT but are statistically most likely benign given patient age. Correlation with renal function recommended and given the number of cystic lesions bilaterally, outpatient urology follow-up may be warranted.

## 2023-07-04 NOTE — Telephone Encounter (Signed)
Patient wanted to establish care with Toni Amend PA- Office visit scheduled for next week

## 2023-07-08 ENCOUNTER — Ambulatory Visit: Payer: Self-pay | Admitting: Physician Assistant

## 2023-07-09 ENCOUNTER — Ambulatory Visit: Payer: Self-pay | Admitting: Physician Assistant

## 2023-07-29 ENCOUNTER — Ambulatory Visit: Payer: Self-pay | Admitting: Adult Health

## 2023-08-08 ENCOUNTER — Emergency Department: Payer: Self-pay

## 2023-08-08 ENCOUNTER — Emergency Department
Admission: EM | Admit: 2023-08-08 | Discharge: 2023-08-08 | Disposition: A | Discharge status: Home / Self Care | Payer: Self-pay | Attending: Emergency Medicine | Admitting: Emergency Medicine

## 2023-08-08 ENCOUNTER — Other Ambulatory Visit: Payer: Self-pay

## 2023-08-08 DIAGNOSIS — R112 Nausea with vomiting, unspecified: Secondary | ICD-10-CM | POA: Insufficient documentation

## 2023-08-08 DIAGNOSIS — R55 Syncope and collapse: Secondary | ICD-10-CM | POA: Insufficient documentation

## 2023-08-08 DIAGNOSIS — R197 Diarrhea, unspecified: Secondary | ICD-10-CM | POA: Insufficient documentation

## 2023-08-08 LAB — URINALYSIS, ROUTINE W REFLEX MICROSCOPIC
Bacteria, UA: NONE SEEN
Bilirubin Urine: NEGATIVE
Glucose, UA: NEGATIVE mg/dL
Hgb urine dipstick: NEGATIVE
Ketones, ur: NEGATIVE mg/dL
Leukocytes,Ua: NEGATIVE
Nitrite: NEGATIVE
Protein, ur: 30 mg/dL — AB
Specific Gravity, Urine: 1.026 (ref 1.005–1.030)
pH: 7 (ref 5.0–8.0)

## 2023-08-08 LAB — HCG, QUANTITATIVE, PREGNANCY: hCG, Beta Chain, Quant, S: <1 m[IU]/mL (ref <5)

## 2023-08-08 LAB — COMPREHENSIVE METABOLIC PANEL
ALT: 34 U/L (ref 0–44)
AST: 23 U/L (ref 15–41)
Albumin: 4.3 g/dL (ref 3.5–5.0)
Alkaline Phosphatase: 83 U/L (ref 38–126)
Anion gap: 11 (ref 5–15)
BUN: 14 mg/dL (ref 6–20)
CO2: 21 mmol/L — ABNORMAL LOW (ref 22–32)
Calcium: 9.2 mg/dL (ref 8.9–10.3)
Chloride: 108 mmol/L (ref 98–111)
Creatinine, Ser: 0.64 mg/dL (ref 0.44–1.00)
GFR, Estimated: >60 mL/min (ref >60)
Glucose, Bld: 107 mg/dL — ABNORMAL HIGH (ref 70–99)
Potassium: 3.5 mmol/L (ref 3.5–5.1)
Sodium: 140 mmol/L (ref 135–145)
Total Bilirubin: 0.9 mg/dL (ref 0.0–1.2)
Total Protein: 8.1 g/dL (ref 6.5–8.1)

## 2023-08-08 LAB — CBC
HCT: 41.3 % (ref 36.0–46.0)
Hemoglobin: 13.2 g/dL (ref 12.0–15.0)
MCH: 26.7 pg (ref 26.0–34.0)
MCHC: 32 g/dL (ref 30.0–36.0)
MCV: 83.6 fL (ref 80.0–100.0)
Platelets: 336 10*3/uL (ref 150–400)
RBC: 4.94 MIL/uL (ref 3.87–5.11)
RDW: 15.7 % — ABNORMAL HIGH (ref 11.5–15.5)
WBC: 11.3 10*3/uL — ABNORMAL HIGH (ref 4.0–10.5)
nRBC: 0 % (ref 0.0–0.2)

## 2023-08-08 LAB — TROPONIN I (HIGH SENSITIVITY): Troponin I (High Sensitivity): <2 ng/L (ref <18)

## 2023-08-08 LAB — CBG MONITORING, ED: Glucose-Capillary: 100 mg/dL — ABNORMAL HIGH (ref 70–99)

## 2023-08-08 LAB — LIPASE, BLOOD: Lipase: 29 U/L (ref 11–51)

## 2023-08-08 MED ORDER — LOPERAMIDE HCL 2 MG PO CAPS
2.0000 mg | ORAL_CAPSULE | Freq: Once | ORAL | Status: AC
  Administered 2023-08-08: 2 mg via ORAL
  Filled 2023-08-08: qty 1

## 2023-08-08 MED ORDER — ONDANSETRON 4 MG PO TBDP: 4.0000 mg | ORAL_TABLET | Freq: Four times a day (QID) | ORAL | 0 refills | Status: DC | PRN

## 2023-08-08 MED ORDER — ONDANSETRON HCL 4 MG/2ML IJ SOLN
4.0000 mg | Freq: Once | INTRAMUSCULAR | Status: AC
  Administered 2023-08-08: 4 mg via INTRAVENOUS
  Filled 2023-08-08: qty 2

## 2023-08-08 MED ORDER — SODIUM CHLORIDE 0.9 % IV BOLUS (SEPSIS)
1000.0000 mL | Freq: Once | INTRAVENOUS | Status: AC
  Administered 2023-08-08: 1000 mL via INTRAVENOUS

## 2023-08-08 MED ORDER — KETOROLAC TROMETHAMINE 30 MG/ML IJ SOLN
30.0000 mg | Freq: Once | INTRAMUSCULAR | Status: AC
  Administered 2023-08-08: 30 mg via INTRAVENOUS
  Filled 2023-08-08: qty 1

## 2023-08-08 NOTE — ED Notes (Signed)
 Pt taken to CT.

## 2023-08-08 NOTE — ED Notes (Signed)
 Pt up to toilet

## 2023-08-08 NOTE — ED Provider Notes (Addendum)
 [facility] Provider Note    Event Date/Time   First MD Initiated Contact with Patient 08/08/23 [phone]     (approximate)   History   Loss of Consciousness   HPI  Marissa Hicks is a 23 y.o. female with no significant past medical history presents to the emergency department after syncopal event.  She states that just prior to arrival she was not feeling well and was nauseated.  She had 1 episode of vomiting and then felt lightheaded and had a syncopal event.  She states she was incontinent of urine but not of stool.  No tongue biting.  No history of seizure.  Is complaining of headache but no neck or back pain.  No numbness, tingling or weakness.  She denies any chest pain or shortness of breath. No abdominal pain.  She is not having diarrhea.  No bloody stools or melena.  No dysuria, hematuria, abnormal vaginal bleeding or discharge.  Has never had a syncopal event before.  No history of PE, DVT, exogenous estrogen use, recent fractures, surgery, trauma, hospitalization, prolonged travel or other immobilization. No lower extremity swelling or pain. No calf tenderness.   History provided by patient, EMS.    History reviewed. No pertinent past medical history.  History reviewed. No pertinent surgical history.  MEDICATIONS:  Prior to Admission medications   Medication Sig Start Date End Date Taking? Authorizing Provider  HYDROcodone-acetaminophen (NORCO) 5-325 MG tablet Take 1 tablet by mouth every 6 (six) hours as needed for moderate pain (pain score 4-6). 05/23/23   Irean Hong, MD  metoCLOPramide (REGLAN) 10 MG tablet Take 1 tablet (10 mg total) by mouth every 6 (six) hours. Patient not taking: Reported on 07/05/2021 06/23/21   Eber Hong, MD  metroNIDAZOLE (FLAGYL) 500 MG tablet Take 1 tablet (500 mg total) by mouth 2 (two) times daily. 05/23/23   Irean Hong, MD    Physical Exam   Triage Vital Signs: ED Triage Vitals  Encounter Vitals Group      BP 08/08/23 0531 137/86     Systolic BP Percentile --      Diastolic BP Percentile --      Pulse Rate 08/08/23 0531 (!) 106     Resp 08/08/23 0531 20     Temp 08/08/23 0531 98.8 F (37.1 C)     Temp Source 08/08/23 0531 Oral     SpO2 08/08/23 0531 99 %     Weight 08/08/23 0530 200 lb (90.7 kg)     Height 08/08/23 0530 5\' 5"  (1.651 m)     Head Circumference --      Peak Flow --      Pain Score 08/08/23 0530 7     Pain Loc --      Pain Education --      Exclude from Growth Chart --     Most recent vital signs: Vitals:   08/08/23 0531 08/08/23 0831  BP: 137/86 138/88  Pulse: (!) 106 100  Resp: 20 18  Temp: 98.8 F (37.1 C) 98 F (36.7 C)  SpO2: 99% 99%    CONSTITUTIONAL: Alert, responds appropriately to questions. Well-appearing; well-nourished HEAD: Normocephalic, atraumatic EYES: Conjunctivae clear, pupils appear equal, sclera nonicteric ENT: normal nose; moist mucous membranes NECK: Supple, normal ROM, no midline spinal tenderness or step-off or deformity CARD: Regular and tachycardic; S1 and S2 appreciated RESP: Normal chest excursion without splinting or tachypnea; breath sounds clear and equal bilaterally; no wheezes, no rhonchi, no  rales, no hypoxia or respiratory distress, speaking full sentences ABD/GI: Non-distended; soft, non-tender, no rebound, no guarding, no peritoneal signs, no tenderness at McBurney's point BACK: The back appears normal, no midline spinal tenderness or step-off or deformity EXT: Normal ROM in all joints; no deformity noted, no edema, no calf tenderness or calf swelling SKIN: Normal color for age and race; warm; no rash on exposed skin NEURO: Moves all extremities equally, normal speech, normal sensation diffusely, no facial asymmetry, normal gait PSYCH: The patient's mood and manner are appropriate.   ED Results / Procedures / Treatments   LABS: (all labs ordered are listed, but only abnormal results are displayed) Labs Reviewed  CBC -  Abnormal; Notable for the following components:      Result Value   WBC 11.3 (*)    RDW 15.7 (*)    All other components within normal limits  URINALYSIS, ROUTINE W REFLEX MICROSCOPIC - Abnormal; Notable for the following components:   Color, Urine YELLOW (*)    APPearance HAZY (*)    Protein, ur 30 (*)    All other components within normal limits  COMPREHENSIVE METABOLIC PANEL - Abnormal; Notable for the following components:   CO2 21 (*)    Glucose, Bld 107 (*)    All other components within normal limits  CBG MONITORING, ED - Abnormal; Notable for the following components:   Glucose-Capillary 100 (*)    All other components within normal limits  LIPASE, BLOOD  HCG, QUANTITATIVE, PREGNANCY  TROPONIN I (HIGH SENSITIVITY)     EKG:  EKG Interpretation Date/Time:  Friday August 08 2023 05:35:03 EST Ventricular Rate:  108 PR Interval:  166 QRS Duration:  84 QT Interval:  320 QTC Calculation: 428 R Axis:   75  Text Interpretation: Sinus tachycardia Nonspecific T wave abnormality Abnormal ECG When compared with ECG of 05-Mar-2018 01:46, PREVIOUS ECG IS PRESENT Confirmed by Rochele Raring [phone]) on 08/08/2023 6:45:23 AM         RADIOLOGY: My personal review and interpretation of imaging: CT head unremarkable.  I have personally reviewed all radiology reports.   CT HEAD WO CONTRAST ( ) Result Date: 08/08/2023 CLINICAL DATA:  Head trauma, moderate to severe. EXAM: CT HEAD WITHOUT CONTRAST TECHNIQUE: Contiguous axial images were obtained from the base of the skull through the vertex without intravenous contrast. RADIATION DOSE REDUCTION: This exam was performed according to the departmental dose-optimization program which includes automated exposure control, adjustment of the mA and/or kV according to patient size and/or use of iterative reconstruction technique. COMPARISON:  02/16/2021 FINDINGS: Brain: No evidence of acute infarction, hemorrhage, hydrocephalus, extra-axial  collection or mass lesion/mass effect. Vascular: No hyperdense vessel or unexpected calcification. Skull: Normal. Negative for fracture or focal lesion. Sinuses/Orbits: No acute finding. IMPRESSION: No evidence of intracranial injury. Electronically Signed   By: Tiburcio Pea M.D.   On: 08/08/2023 07:33     PROCEDURES:  Critical Care performed: No    .1-3 Lead EKG Interpretation  Performed by: Santanna Whitford, Layla Maw, DO Authorized by: Raahil Ong, Layla Maw, DO     Interpretation: abnormal     ECG rate:  106   ECG rate assessment: tachycardic     Rhythm: sinus tachycardia     Ectopy: none     Conduction: normal       IMPRESSION / MDM / ASSESSMENT AND PLAN / ED COURSE  I reviewed the triage vital signs and the nursing notes.    Patient here with vomiting, diarrhea, syncope.  The  patient is on the cardiac monitor to evaluate for evidence of arrhythmia and/or significant heart rate changes.   DIFFERENTIAL DIAGNOSIS (includes but not limited to):   Vasovagal syncope, dehydration, anemia, electrolyte derangement, viral gastroenteritis, doubt appendicitis, colitis, perforation.  Low suspicion for ACS.  No risk factors for PE.  Doubt dissection.  Differential also includes concussion, skull fracture, intracranial hemorrhage from head injury.  Doubt seizure.   Patient's presentation is most consistent with acute presentation with potential threat to life or bodily function.   PLAN: Will obtain labs, urine, CT of the head.  Will give IV fluids, Toradol, Zofran, Imodium for symptomatic relief.  EKG nonischemic.  No interval abnormality, arrhythmia, delta wave, Brugada.  Suspect viral gastroenteritis and vasovagal syncope.  She is tachycardic but no risk factors for PE.  Low suspicion for ACS but will check 1 troponin.  Blood glucose normal.   MEDICATIONS GIVEN IN ED: Medications  sodium chloride 0.9 % bolus 1,000 mL (1,000 mLs Intravenous New Bag/Given 08/08/23 0652)  ondansetron (ZOFRAN)  injection 4 mg (4 mg Intravenous Given 08/08/23 0653)  ketorolac (TORADOL) 30 MG/ML injection 30 mg (30 mg Intravenous Given 08/08/23 0653)  loperamide (IMODIUM) capsule 2 mg (2 mg Oral Given 08/08/23 0654)     ED COURSE: 7:53 AM  Patient has slight leukocytosis of 11.3.  Normal hemoglobin.  Normal electrolytes, LFTs and lipase.  Troponin negative.  CT head reviewed and interpreted by myself and the radiologist and shows no acute abnormality.  Patient reports feeling much better.  Tolerating p.o.  Urine pending.  Anticipate discharge home with antiemetics, supportive care instructions.   8:40 AM  Pt's urine shows small amount of blood but no other sign of infection.  No indication for antibiotics.  I feel she is safe for discharge.  At this time, I do not feel there is any life-threatening condition present. I reviewed all nursing notes, vitals, pertinent previous records.  All lab and urine results, EKGs, imaging ordered have been independently reviewed and interpreted by myself.  I reviewed all available radiology reports from any imaging ordered this visit.  Based on my assessment, I feel the patient is safe to be discharged home without further emergent workup and can continue workup as an outpatient as needed. Discussed all findings, treatment plan as well as usual and customary return precautions.  They verbalize understanding and are comfortable with this plan.  Outpatient follow-up has been provided as needed.  All questions have been answered.   CONSULTS:  none   OUTSIDE RECORDS REVIEWED: No prior records for review.       FINAL CLINICAL IMPRESSION(S) / ED DIAGNOSES   Final diagnoses:  Nausea vomiting and diarrhea  Syncope and collapse     Rx / DC Orders   ED Discharge Orders          Ordered    ondansetron (ZOFRAN-ODT) 4 MG disintegrating tablet  Every 6 hours PRN        08/08/23 0755             Note:  This document was prepared using Dragon voice recognition  software and may include unintentional dictation errors.    Raisha Brabender, Layla Maw, DO 08/08/23 [phone]

## 2023-08-08 NOTE — ED Triage Notes (Signed)
 Pt to ED via EMS from home, pt reports she woke up this morning with n/v went to the bathroom to vomit and she had a syncopal episode. Pt c/o headache and nausea at this time

## 2023-08-08 NOTE — ED Notes (Signed)
 Pt provided with water. Pt denies any want for crackers.

## 2023-08-08 NOTE — Discharge Instructions (Addendum)
 You may alternate Tylenol 1000 mg every 6 hours as needed for pain, fever and Ibuprofen 800 mg every 6-8 hours as needed for pain, fever.  Please take Ibuprofen with food.  Do not take more than 4000 mg of Tylenol (acetaminophen) in a 24 hour period.  You may use over-the-counter Imodium as needed for diarrhea.  I recommend a bland diet for the next 2 to 3 days.  Please increase your fluid intake.  Your labs were reassuring.  Your head CT showed no acute abnormality.  Urine showed no infection.  I suspect you have a viral gastroenteritis causing your symptoms.

## 2023-08-09 HISTORY — PX: KNEE SURGERY: SHX244

## 2023-08-20 ENCOUNTER — Other Ambulatory Visit: Payer: Self-pay

## 2023-08-20 DIAGNOSIS — Z202 Contact with and (suspected) exposure to infections with a predominantly sexual mode of transmission: Secondary | ICD-10-CM | POA: Insufficient documentation

## 2023-08-20 DIAGNOSIS — Z5321 Procedure and treatment not carried out due to patient leaving prior to being seen by health care provider: Secondary | ICD-10-CM | POA: Insufficient documentation

## 2023-08-20 DIAGNOSIS — R1030 Lower abdominal pain, unspecified: Secondary | ICD-10-CM | POA: Insufficient documentation

## 2023-08-20 DIAGNOSIS — R300 Dysuria: Secondary | ICD-10-CM | POA: Insufficient documentation

## 2023-08-20 LAB — CBC WITH DIFFERENTIAL/PLATELET
Abs Immature Granulocytes: 0.04 10*3/uL (ref 0.00–0.07)
Basophils Absolute: 0.1 10*3/uL (ref 0.0–0.1)
Basophils Relative: 1 %
Eosinophils Absolute: 0.2 10*3/uL (ref 0.0–0.5)
Eosinophils Relative: 3 %
HCT: 39.4 % (ref 36.0–46.0)
Hemoglobin: 12.4 g/dL (ref 12.0–15.0)
Immature Granulocytes: 1 %
Lymphocytes Relative: 35 %
Lymphs Abs: 2.5 10*3/uL (ref 0.7–4.0)
MCH: 26 pg (ref 26.0–34.0)
MCHC: 31.5 g/dL (ref 30.0–36.0)
MCV: 82.6 fL (ref 80.0–100.0)
Monocytes Absolute: 0.6 10*3/uL (ref 0.1–1.0)
Monocytes Relative: 9 %
Neutro Abs: 3.8 10*3/uL (ref 1.7–7.7)
Neutrophils Relative %: 51 %
Platelets: 435 10*3/uL — ABNORMAL HIGH (ref 150–400)
RBC: 4.77 MIL/uL (ref 3.87–5.11)
RDW: 15.4 % (ref 11.5–15.5)
WBC: 7.3 10*3/uL (ref 4.0–10.5)
nRBC: 0 % (ref 0.0–0.2)

## 2023-08-20 LAB — WET PREP, GENITAL
Sperm: NONE SEEN
Trich, Wet Prep: NONE SEEN
WBC, Wet Prep HPF POC: <10 (ref <10)
Yeast Wet Prep HPF POC: NONE SEEN

## 2023-08-20 LAB — URINALYSIS, ROUTINE W REFLEX MICROSCOPIC
Bacteria, UA: NONE SEEN
Bilirubin Urine: NEGATIVE
Glucose, UA: NEGATIVE mg/dL
Ketones, ur: NEGATIVE mg/dL
Leukocytes,Ua: NEGATIVE
Nitrite: NEGATIVE
Protein, ur: NEGATIVE mg/dL
Specific Gravity, Urine: 1.02 (ref 1.005–1.030)
pH: 7 (ref 5.0–8.0)

## 2023-08-20 LAB — BASIC METABOLIC PANEL
Anion gap: 8 (ref 5–15)
BUN: 11 mg/dL (ref 6–20)
CO2: 25 mmol/L (ref 22–32)
Calcium: 8.9 mg/dL (ref 8.9–10.3)
Chloride: 107 mmol/L (ref 98–111)
Creatinine, Ser: 0.85 mg/dL (ref 0.44–1.00)
GFR, Estimated: >60 mL/min (ref >60)
Glucose, Bld: 104 mg/dL — ABNORMAL HIGH (ref 70–99)
Potassium: 4.1 mmol/L (ref 3.5–5.1)
Sodium: 140 mmol/L (ref 135–145)

## 2023-08-20 LAB — CHLAMYDIA/NGC RT PCR (ARMC ONLY)
Chlamydia Tr: NOT DETECTED
N gonorrhoeae: NOT DETECTED

## 2023-08-20 LAB — POC URINE PREG, ED: Preg Test, Ur: NEGATIVE

## 2023-08-20 NOTE — ED Triage Notes (Signed)
 Pt reports lower abd cramping and dysuria. Pt denies vaginal bleeding or discharge but states she came into contact with someone who tested positive for STD.

## 2023-08-21 ENCOUNTER — Emergency Department
Admission: EM | Admit: 2023-08-21 | Discharge: 2023-08-21 | Discharge status: Against Medical Advice | Payer: Self-pay | Attending: Emergency Medicine | Admitting: Emergency Medicine

## 2023-08-21 NOTE — ED Notes (Signed)
 No answer when called several times from lobby

## 2023-10-21 IMAGING — US US OB < 14 WEEKS - US OB TV
1 series · 13 of 28 positions shown · non-contrast
Comparison: None.

CLINICAL DATA: Pregnancy with pain for 2 days.

EXAM:
OBSTETRIC <14 WK US AND TRANSVAGINAL OB US
TECHNIQUE: Both transabdominal and transvaginal ultrasound examinations were
performed for complete evaluation of the gestation as well as the
maternal uterus, adnexal regions, and pelvic cul-de-sac.
Transvaginal technique was performed to assess early pregnancy.

[Series 1: us ob comp less 14 wks · 13 of 128 slices shown]
[im 5/128]
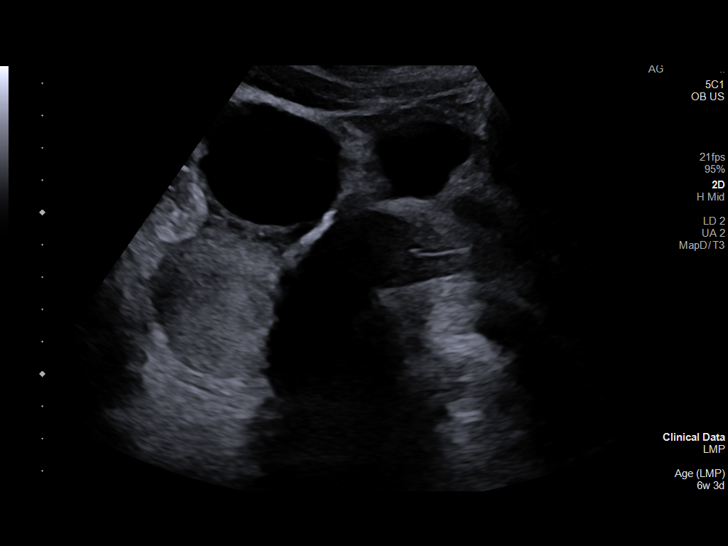
[im 15/128]
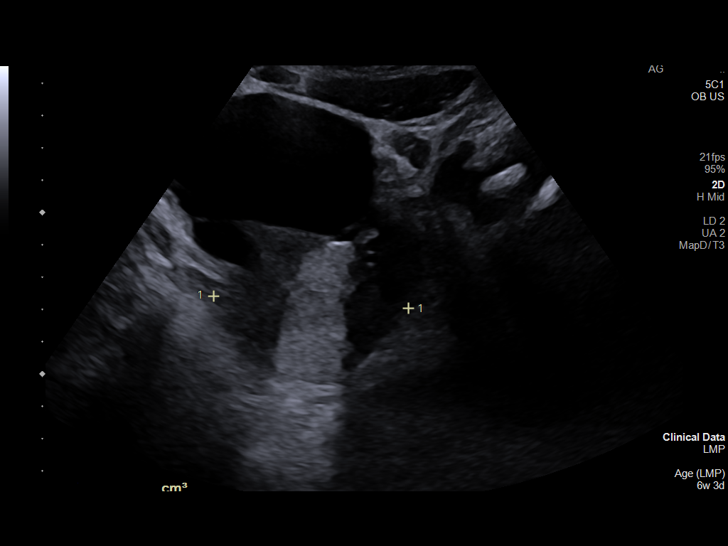
[im 24/128]
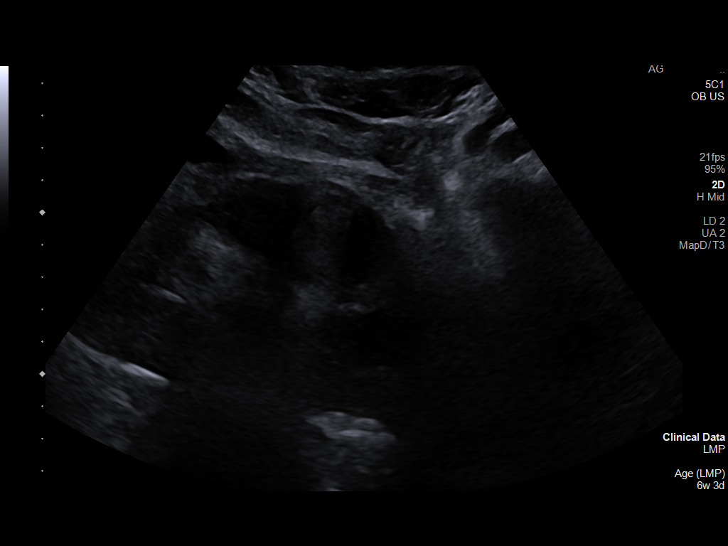
[im 33/128]
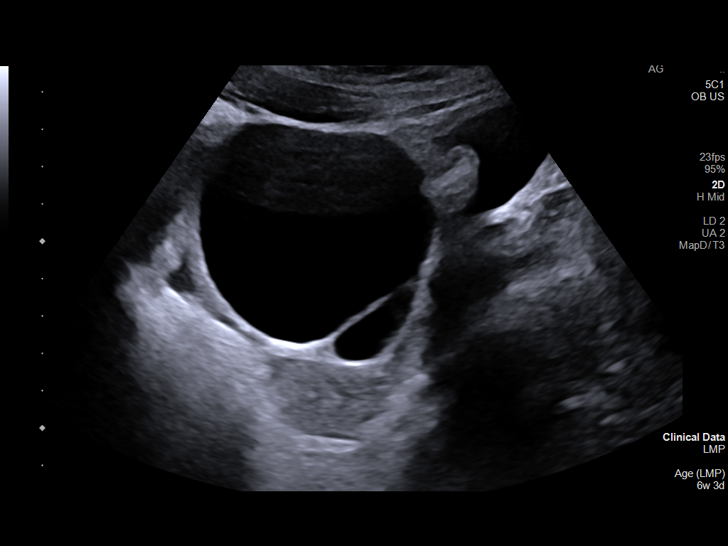
[im 43/128]
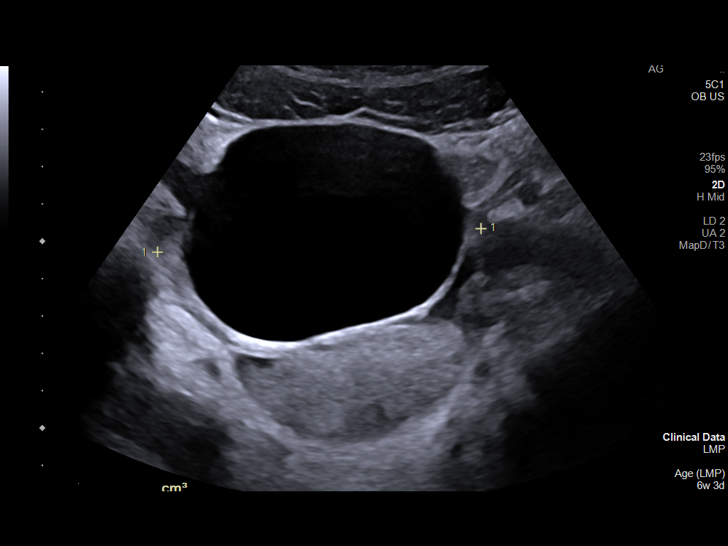
[im 52/128]
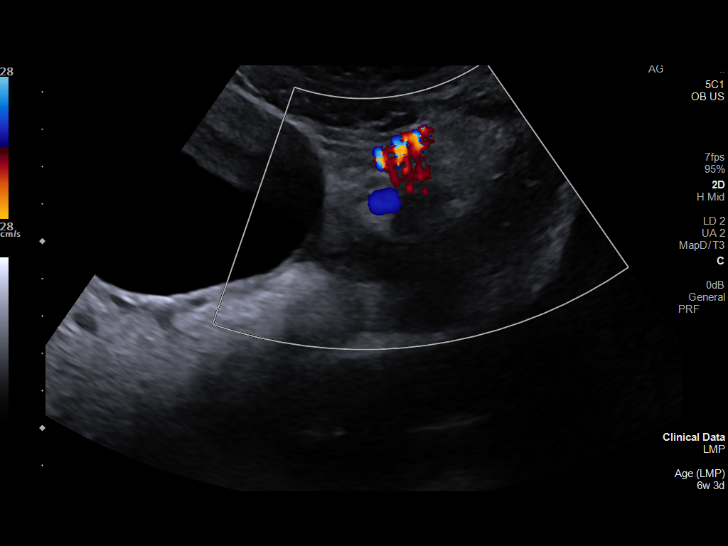
[im 66/128]
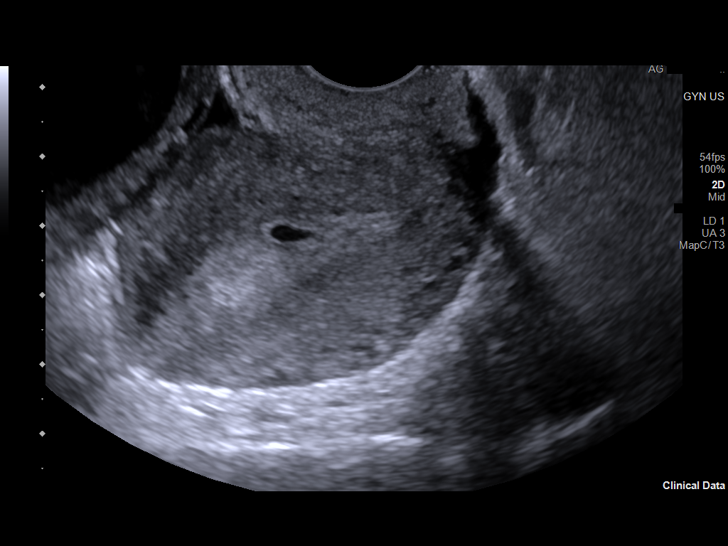
[im 76/128]
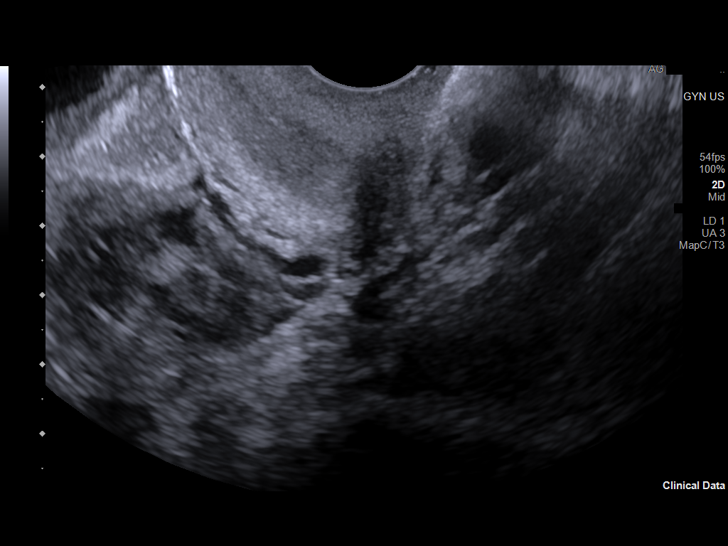
[im 85/128]
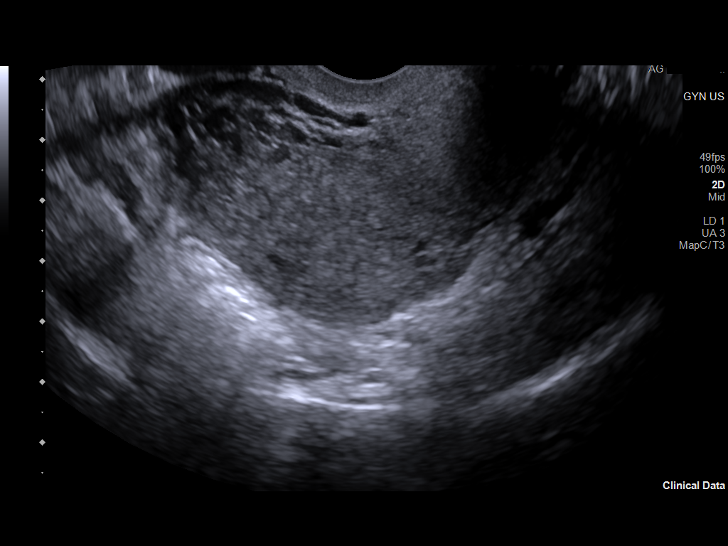
[im 95/128]
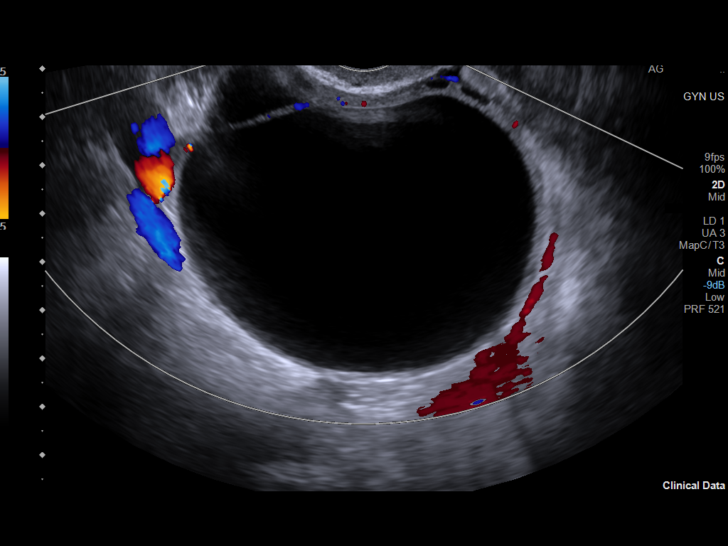
[im 104/128]
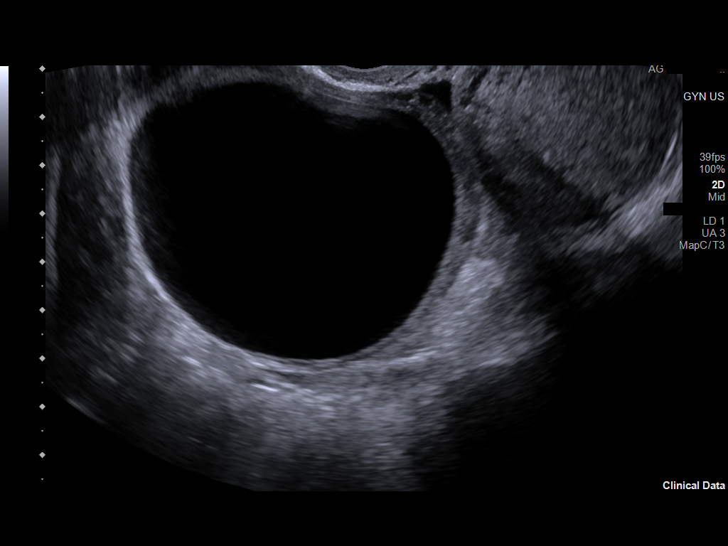
[im 113/128]
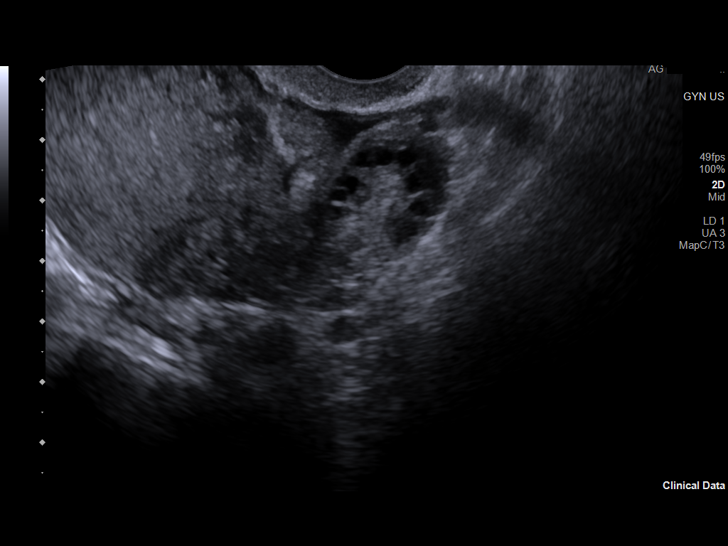
[im 123/128]
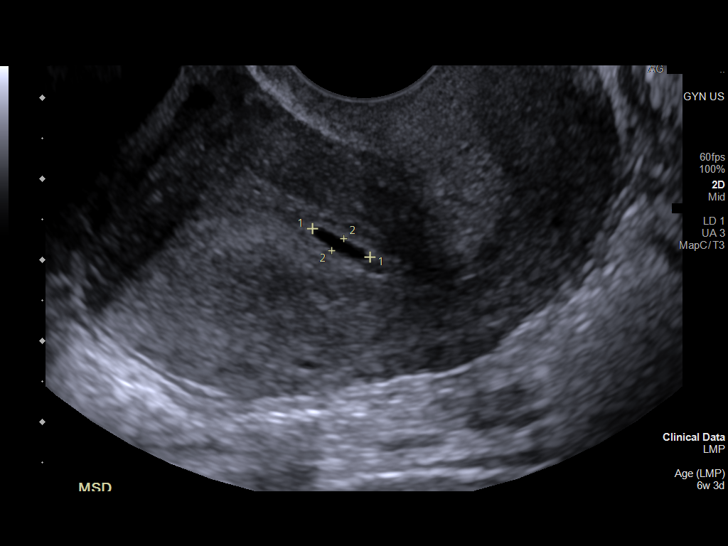

[13 of 28 positions shown; findings below may reference images not displayed]

FINDINGS: Intrauterine gestational sac: Single

Yolk sac:  Not Visualized.

Embryo:  Not Visualized.

Cardiac Activity: Not Visualized.

Heart Rate: Not applicable bpm

MSD: 5.2 mm   5 w   2 d

Subchorionic hemorrhage:  None visualized.

Maternal uterus/adnexae:

Right ovary: There is a large anechoic cyst containing a single
internal area of septation measuring 6.4 x 6.0 x 7.1 cm. The
septation appears thin measuring 3 mm in thickness. No solid mural
nodule identified. No significant increased vascularity.

Left ovary: Normal

Other :None

Free fluid:  Trace free fluid
IMPRESSION: 1. Probable early intrauterine gestational sac, but no yolk sac,
fetal pole, or cardiac activity yet visualized. Recommend follow-up
quantitative B-HCG levels and follow-up US in 14 days to confirm and
assess viability. This recommendation follows SRU consensus
guidelines: Diagnostic Criteria for Nonviable Pregnancy Early in the
First Trimester. N Engl J Med 1039; [DATE].
2. Large anechoic cystic mass arises within the right ovary
containing a single thin internal area of septation. Recommend
follow-up US in 3-6 months. Note: This recommendation does not apply
to premenarchal patients or to those with increased risk (genetic,
family history, elevated tumor markers or other high-risk factors)
of ovarian cancer. Reference: Radiology [DATE]):[id number].

## 2023-11-18 IMAGING — US US OB COMP LESS 14 WK
1 series · 14 of 28 positions shown · non-contrast
Comparison: June 07, 2021.

CLINICAL DATA: Vaginal bleeding.

EXAM:
OBSTETRIC <14 WK ULTRASOUND
TECHNIQUE: Transabdominal ultrasound was performed for evaluation of the
gestation as well as the maternal uterus and adnexal regions.

[Series 1: us ob comp less 14 wks · 14 of 79 slices shown]
[im 3/79]
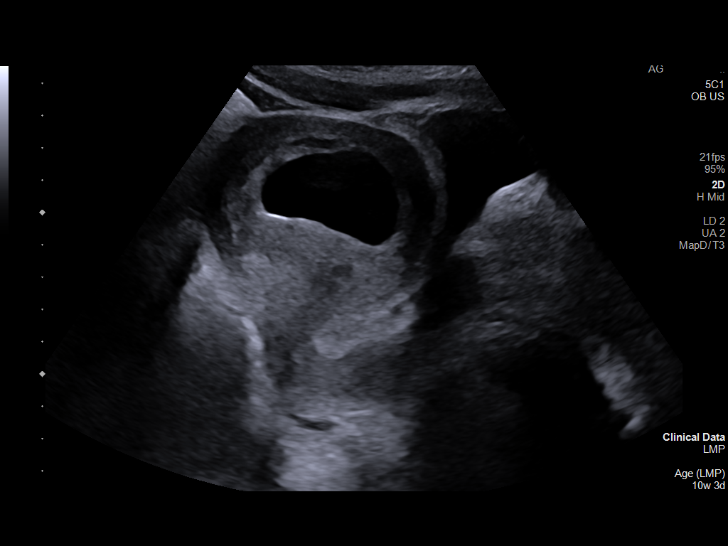
[im 9/79]
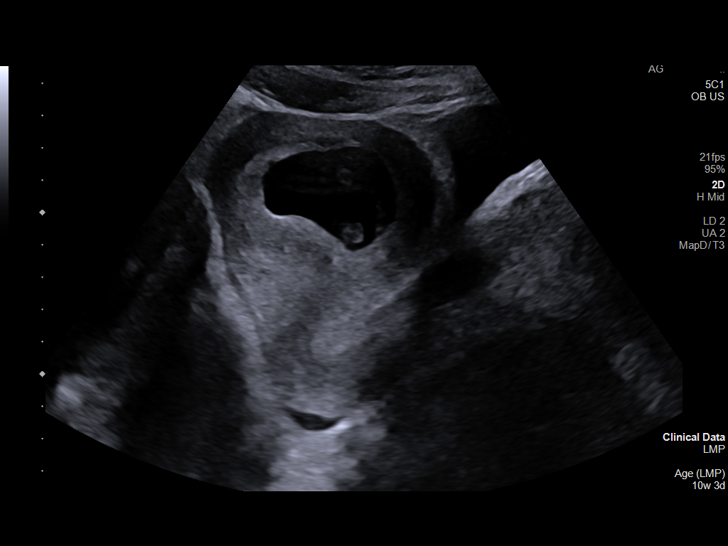
[im 15/79]
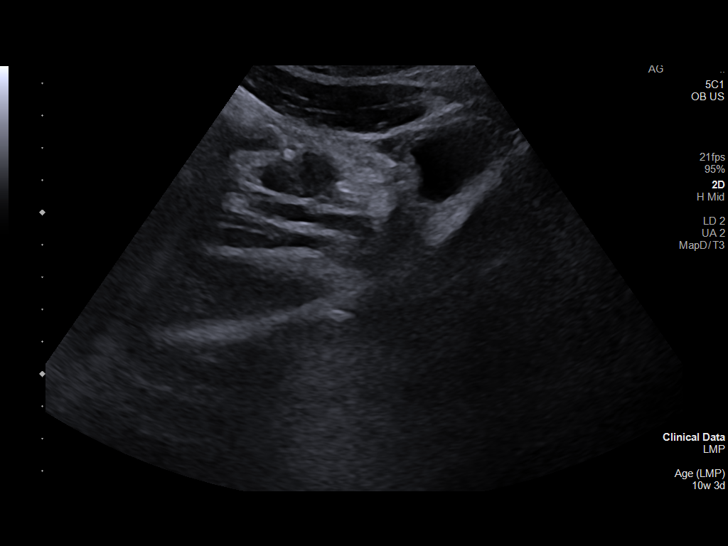
[im 21/79]
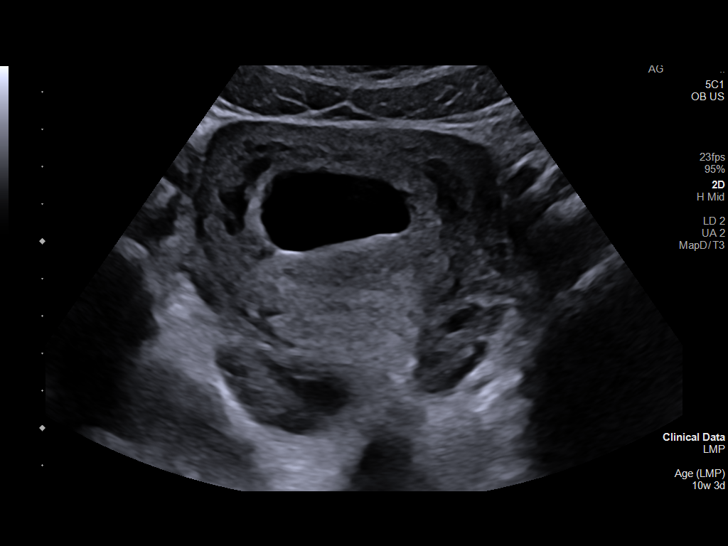
[im 27/79]
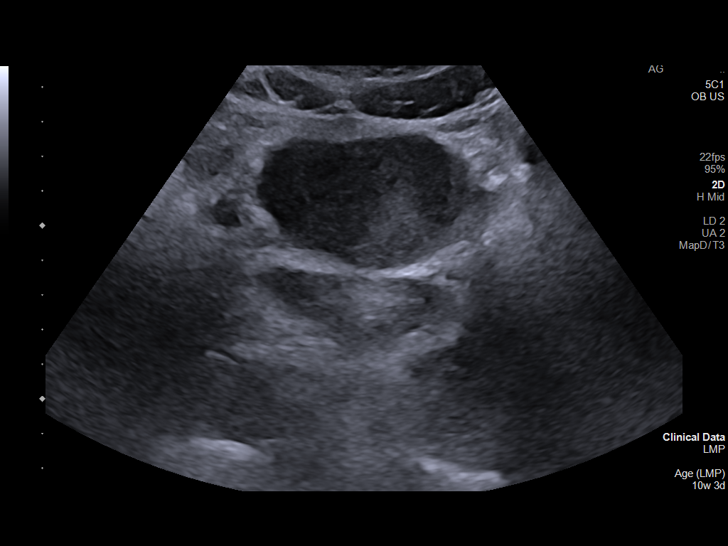
[im 32/79]
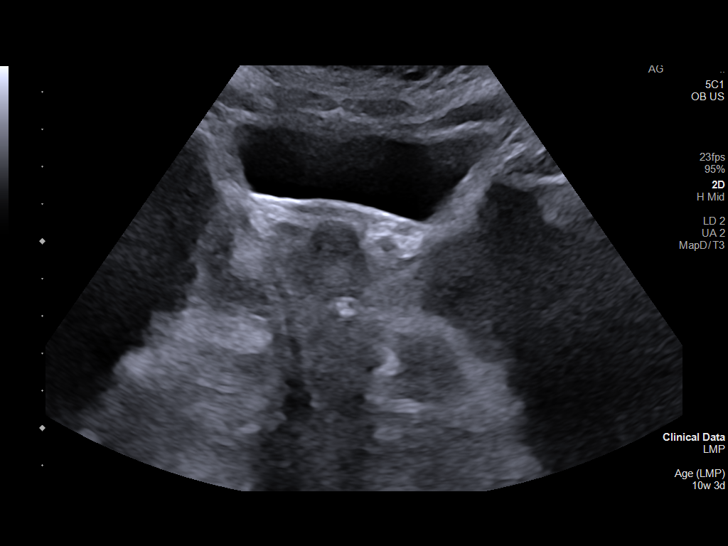
[im 38/79]
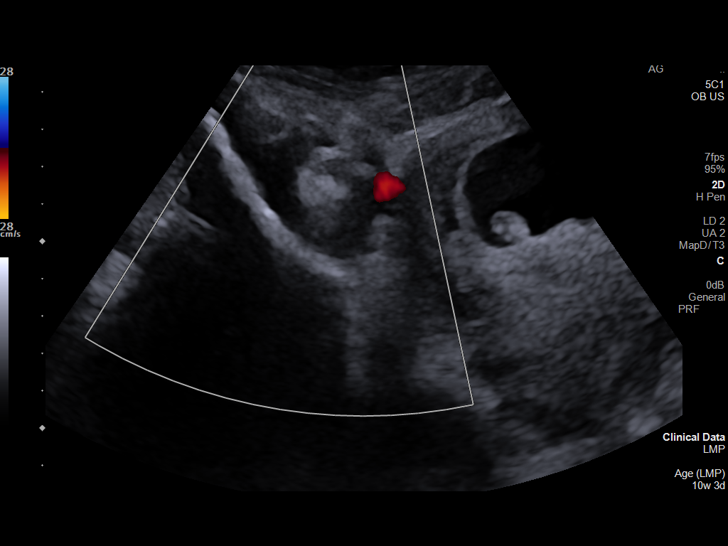
[im 44/79]
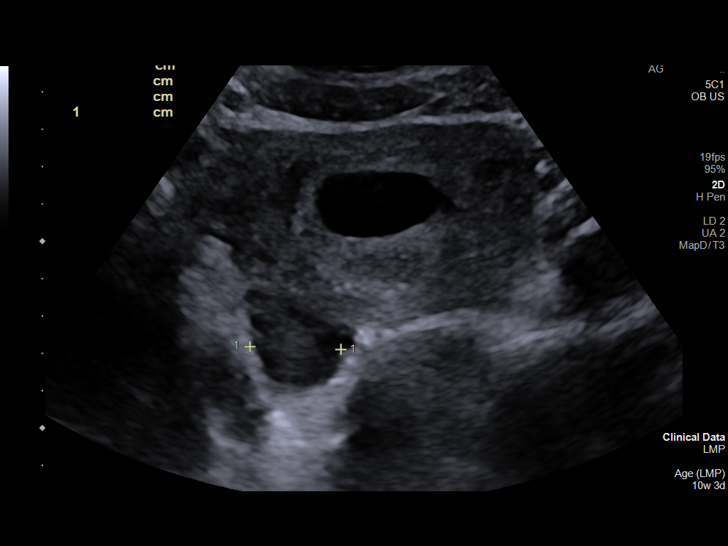
[im 50/79]
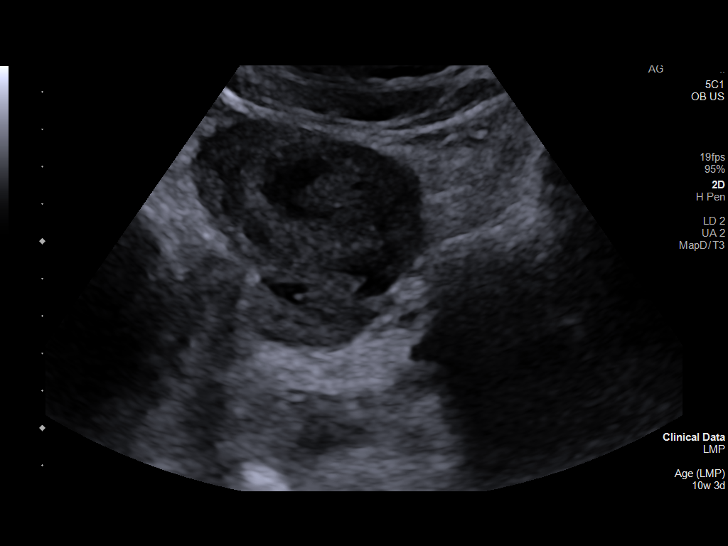
[im 55/79]
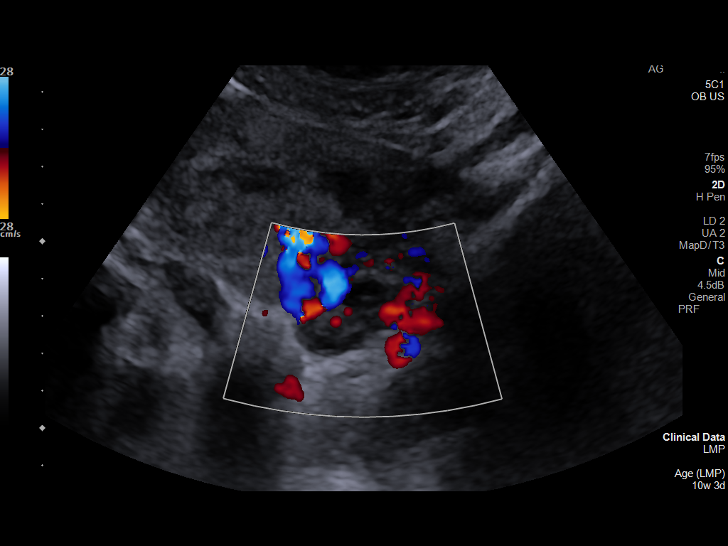
[im 61/79]
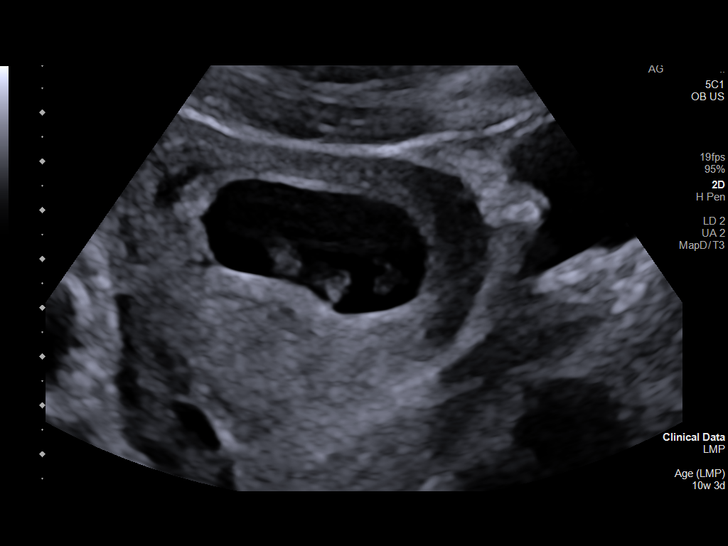
[im 67/79]
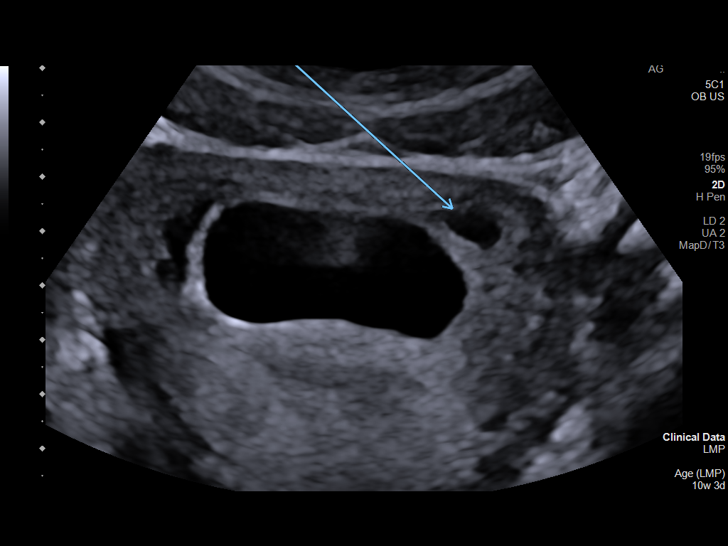
[im 73/79]
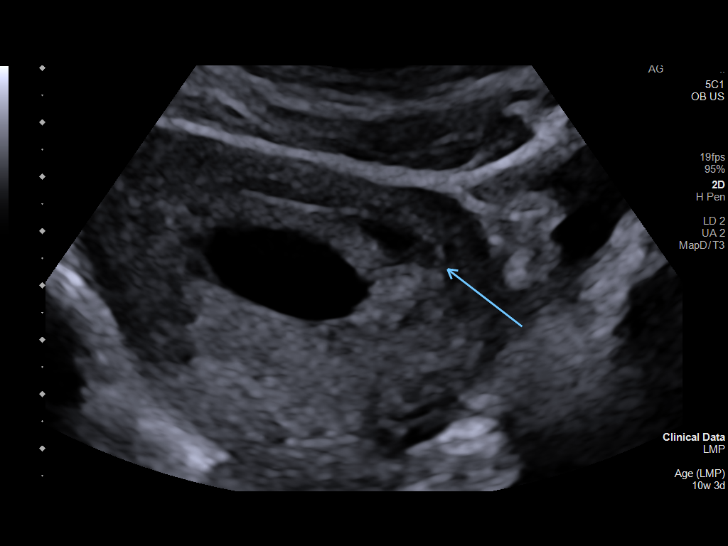
[im 79/79]
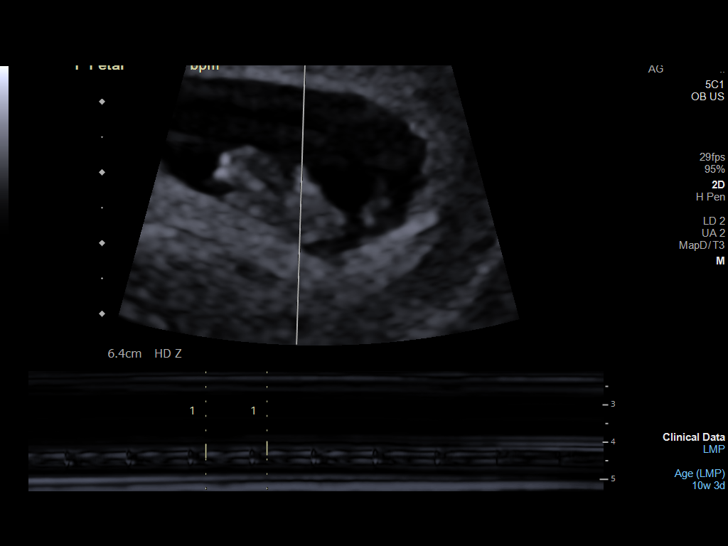

[14 of 28 positions shown; findings below may reference images not displayed]

FINDINGS: Intrauterine gestational sac: Single

Yolk sac:  Visualized.

Embryo:  Visualized.

Cardiac Activity: Visualized.

Heart Rate: 167 bpm

MSD:  3.84 mm   9 w   1 d

CRL:   2.02 mm   8 w 4 d                  US EDC: February 10, 2022.

Subchorionic hemorrhage: Moderate size subchorionic hemorrhage is
noted.

Maternal uterus/adnexae: Ovaries are unremarkable. No free fluid is
noted.
IMPRESSION: Single live intrauterine gestation of 8 weeks 4 days. Moderate size
subchorionic hemorrhage is noted.

## 2023-11-28 IMAGING — US US PELVIC DOPPLER LIMITED
1 series · 14 of 25 positions shown · non-contrast
Comparison: 07/05/2021

CLINICAL DATA: Abdominal pain

EXAM:
OBSTETRIC <14 WK US OB
US DOPPLER ULTRASOUND OF OVARIES
TECHNIQUE: Transabdominal ultrasound examination was performed for complete
evaluation of the gestation as well as the maternal uterus, adnexal
regions, and pelvic cul-de-sac.
Color and duplex Doppler ultrasound was utilized to evaluate blood
flow to the ovaries.

[Series 1: us pelvic doppler limited · 14 of 76 slices shown]
[im 1/76]
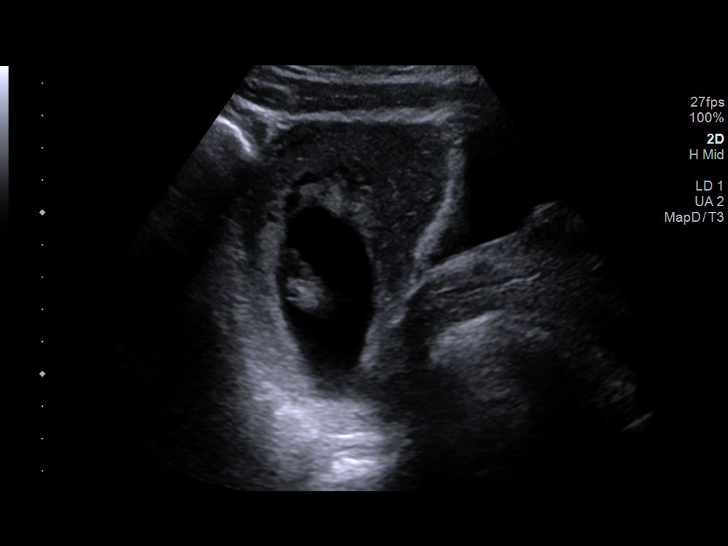
[im 7/76]
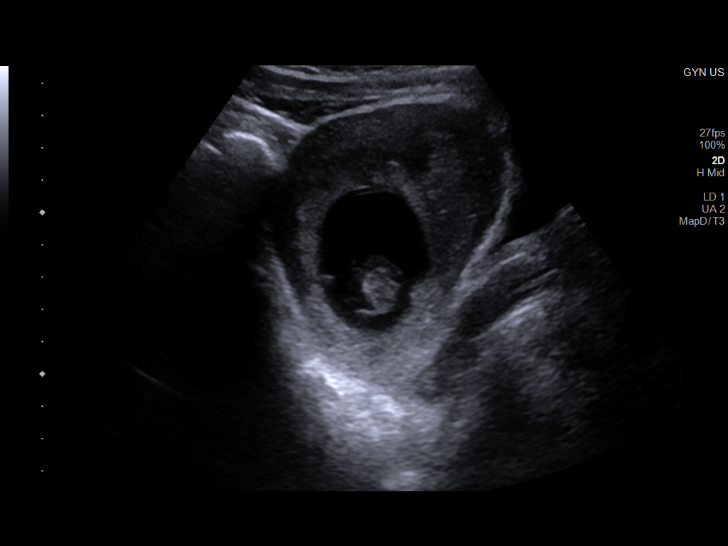
[im 13/76]
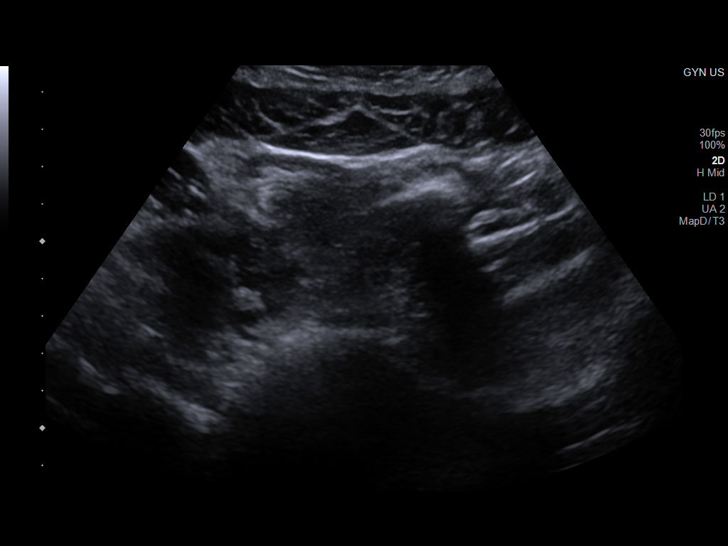
[im 19/76]
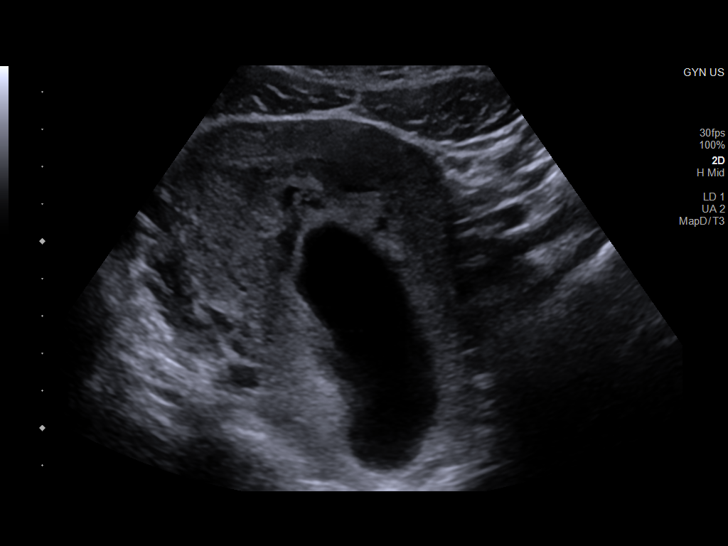
[im 26/76]
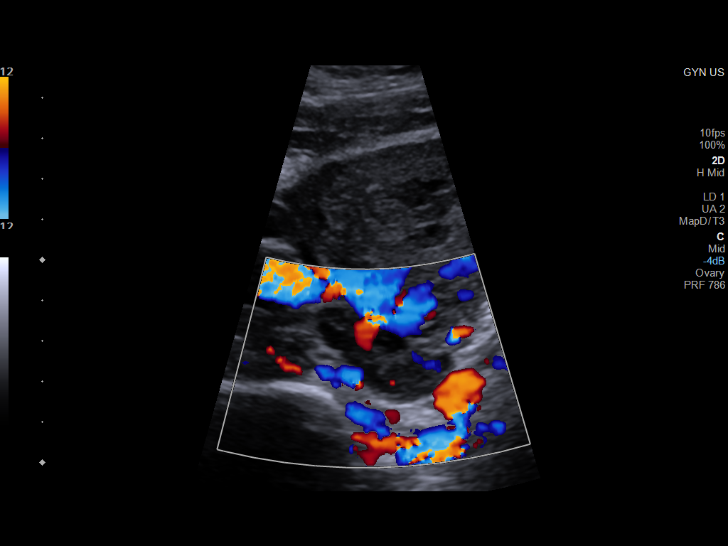
[im 29/76]
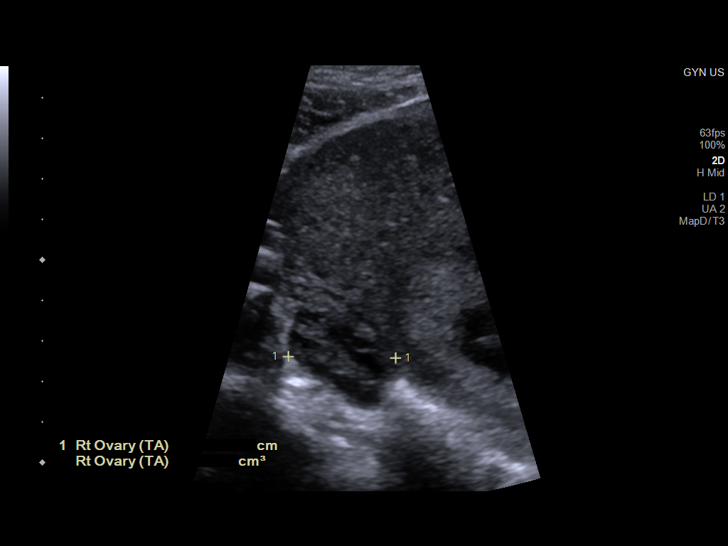
[im 35/76]
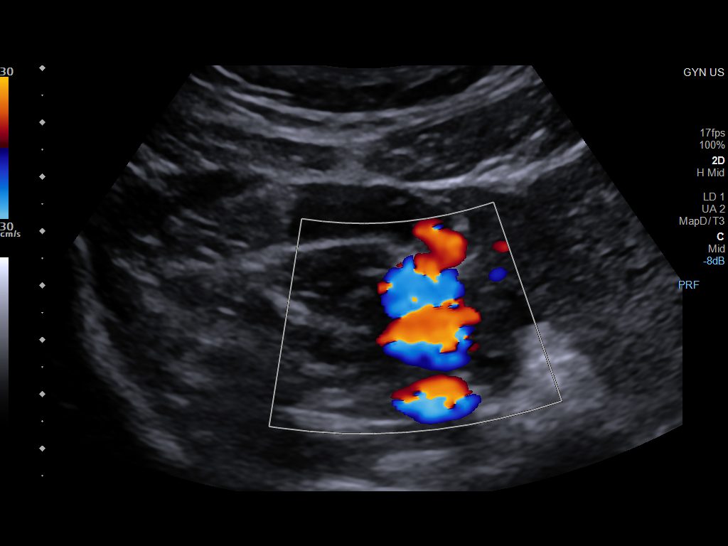
[im 41/76]
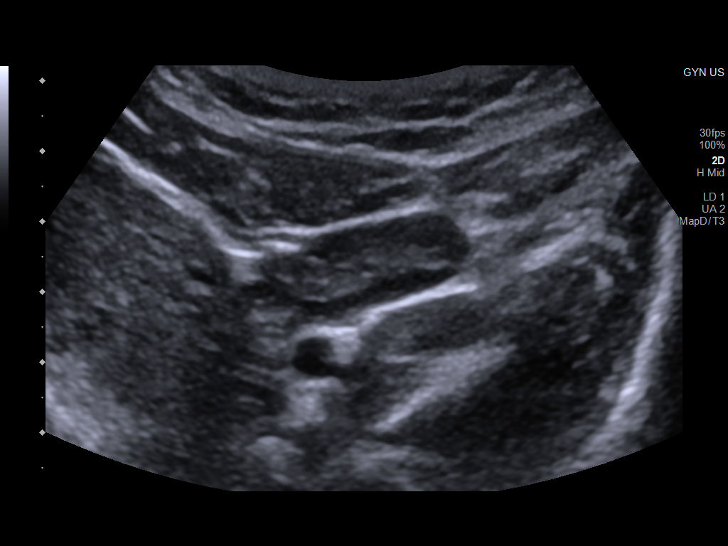
[im 47/76]
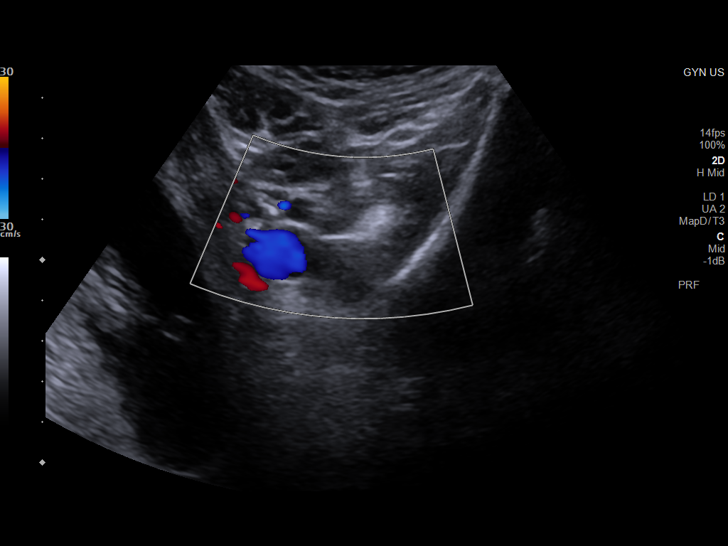
[im 51/76]
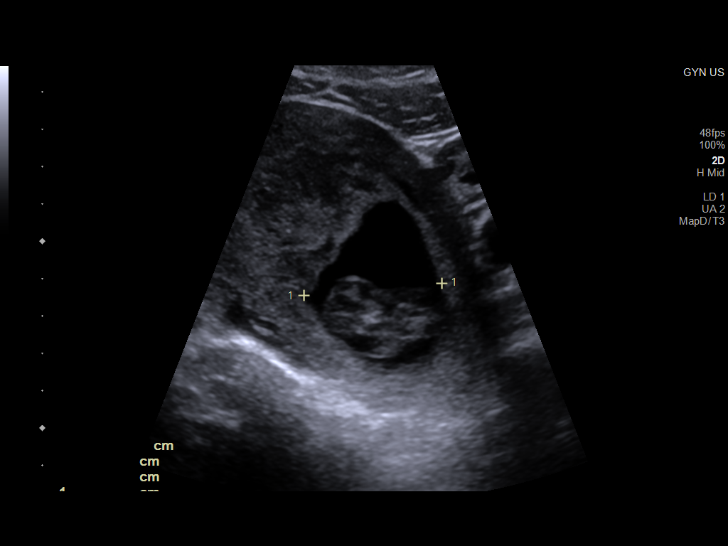
[im 57/76]
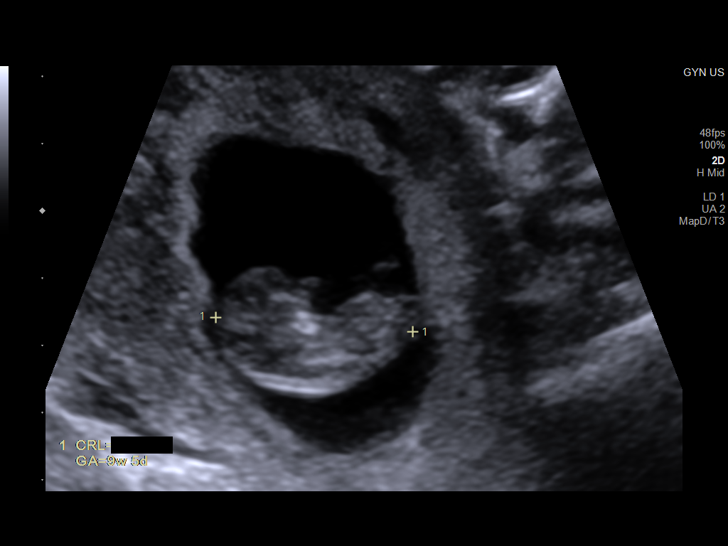
[im 63/76]
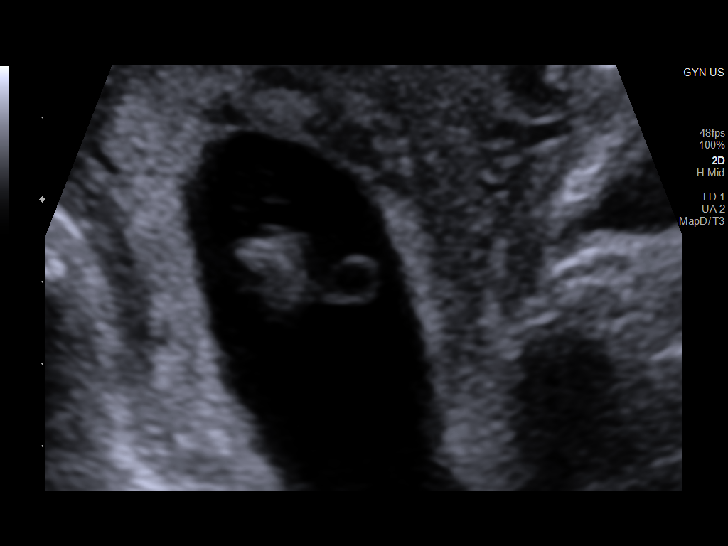
[im 69/76]
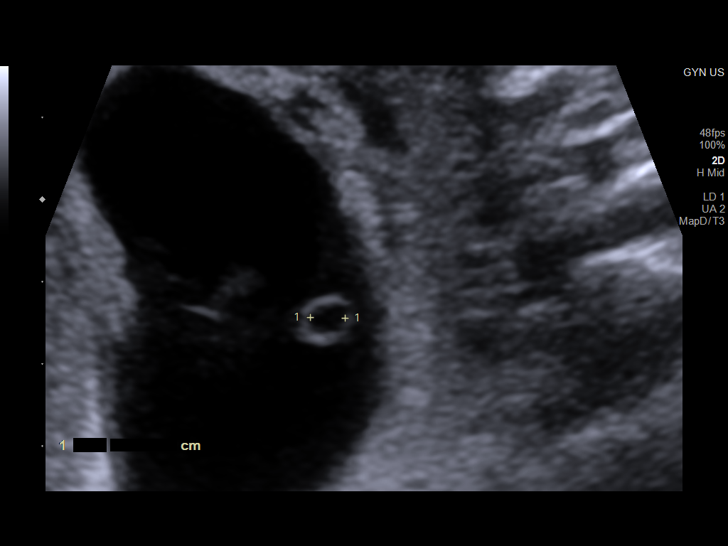
[im 76/76]
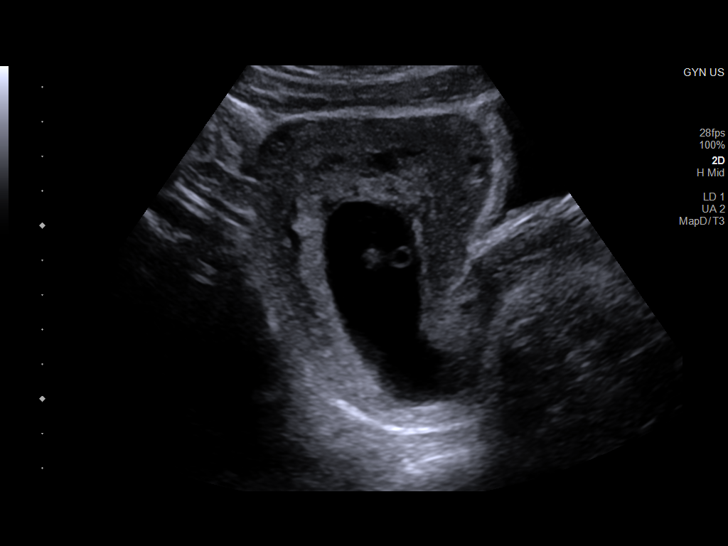

[14 of 25 positions shown; findings below may reference images not displayed]

FINDINGS: Intrauterine gestational sac: Single

Yolk sac:  Visualized.

Embryo:  Visualized.

Cardiac Activity: Visualized.

Heart Rate: 173 bpm

MSD:   mm    w     d

CRL:   30.0 mm   9 w 6 d                  US EDC: 02/11/2022

Subchorionic hemorrhage:  Moderate subchorionic hemorrhage

Maternal uterus/adnexae: No adnexal mass or free fluid.

Pulsed Doppler evaluation of both ovaries demonstrates normal
appearing low-resistance arterial and venous waveforms.
IMPRESSION: Nine week 6 day intrauterine pregnancy. Fetal heart rate 173 beats
per minute. Stable moderate subchorionic hemorrhage.

No evidence of ovarian torsion.

## 2023-11-28 IMAGING — US US OB COMP LESS 14 WK
1 series · 14 of 28 positions shown · non-contrast
Comparison: 07/05/2021

CLINICAL DATA: Abdominal pain

EXAM:
OBSTETRIC <14 WK US OB
US DOPPLER ULTRASOUND OF OVARIES
TECHNIQUE: Transabdominal ultrasound examination was performed for complete
evaluation of the gestation as well as the maternal uterus, adnexal
regions, and pelvic cul-de-sac.
Color and duplex Doppler ultrasound was utilized to evaluate blood
flow to the ovaries.

[Series 1: us ob comp less 14 wk · 14 of 76 slices shown]
[im 3/76]
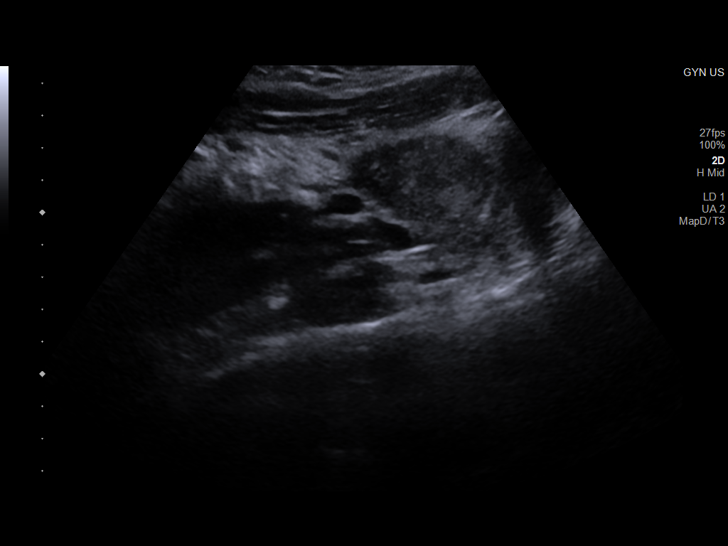
[im 9/76]
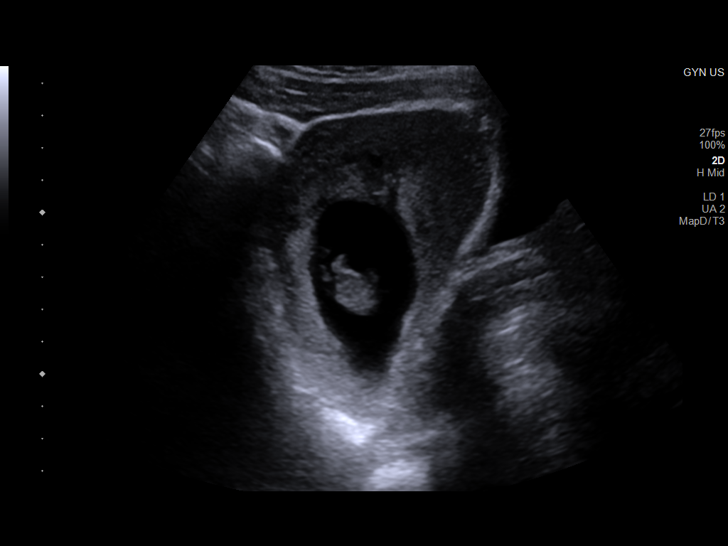
[im 14/76]
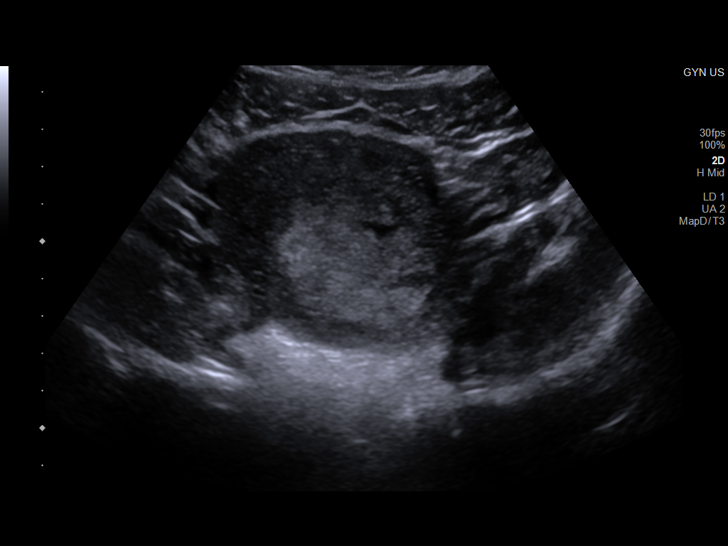
[im 20/76]
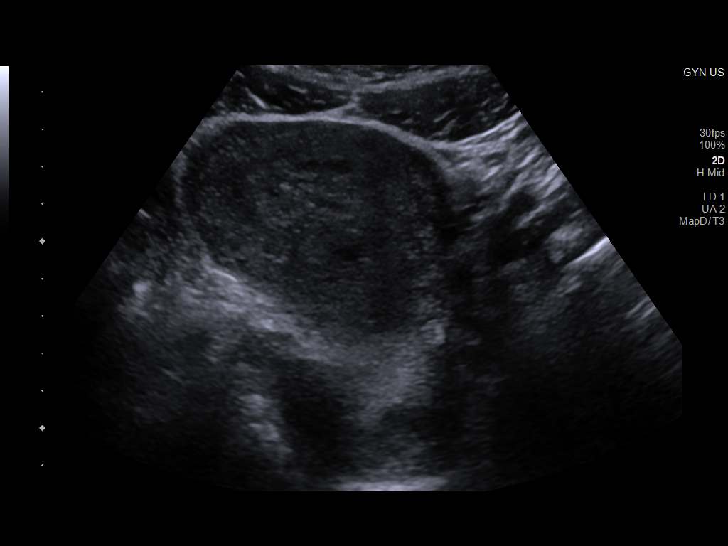
[im 26/76]
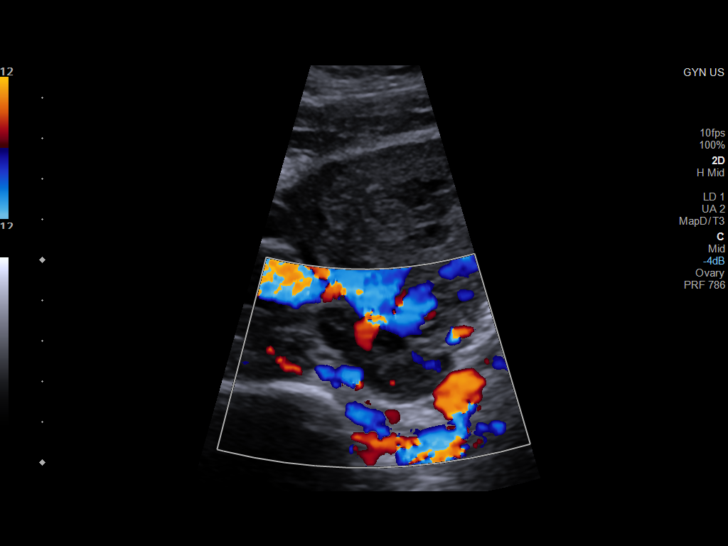
[im 31/76]
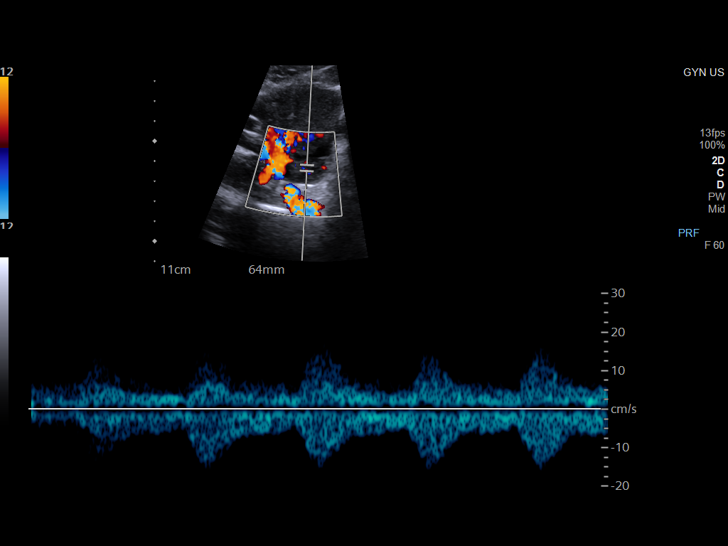
[im 37/76]
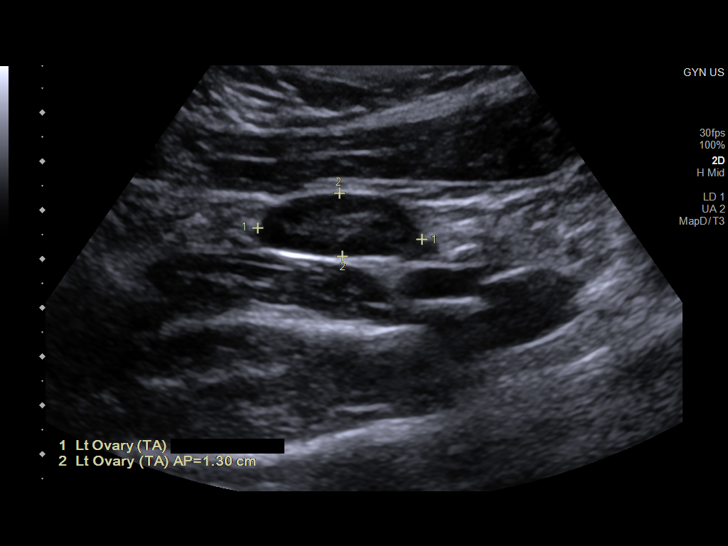
[im 42/76]
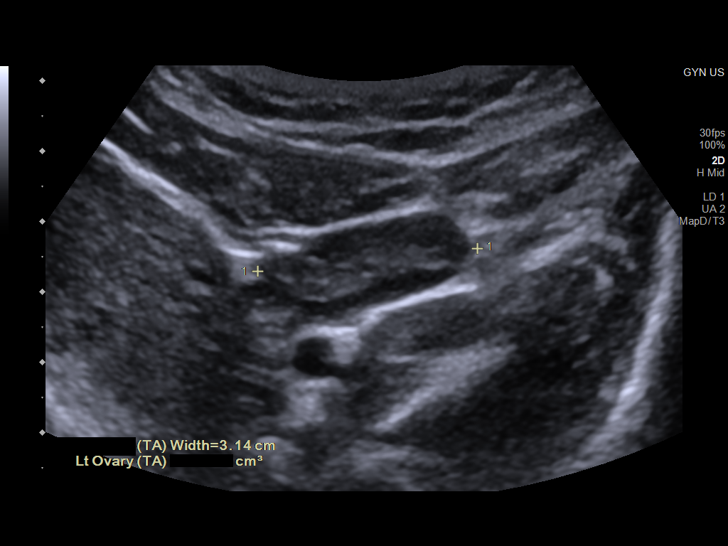
[im 48/76]
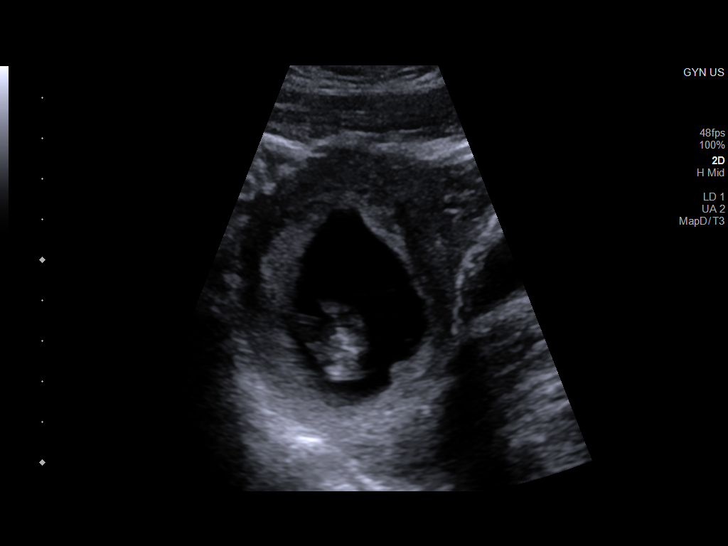
[im 53/76]
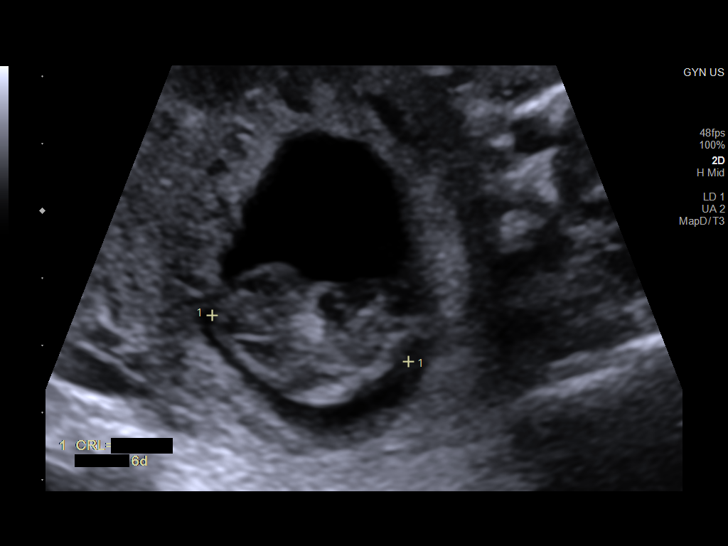
[im 59/76]
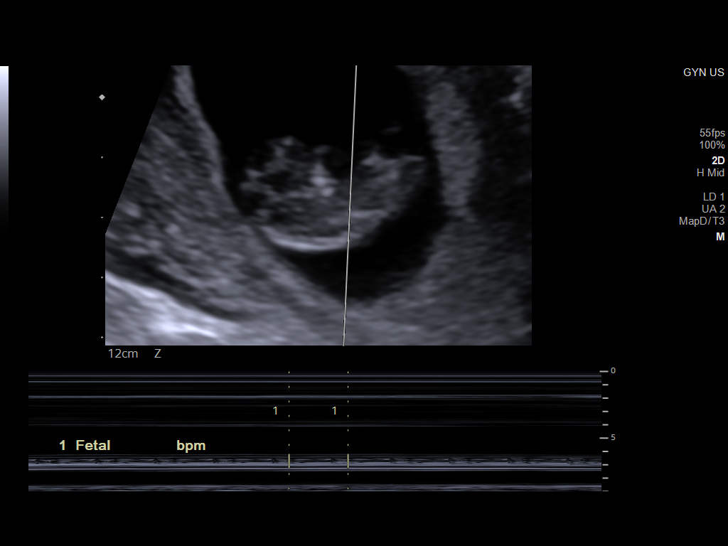
[im 64/76]
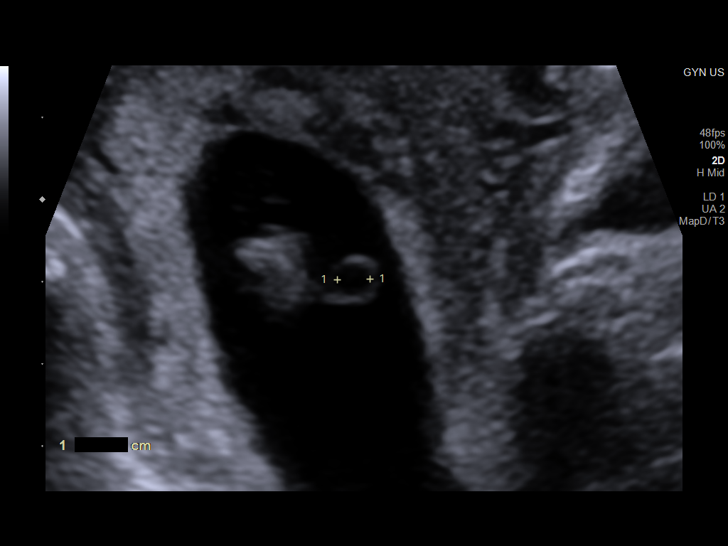
[im 70/76]
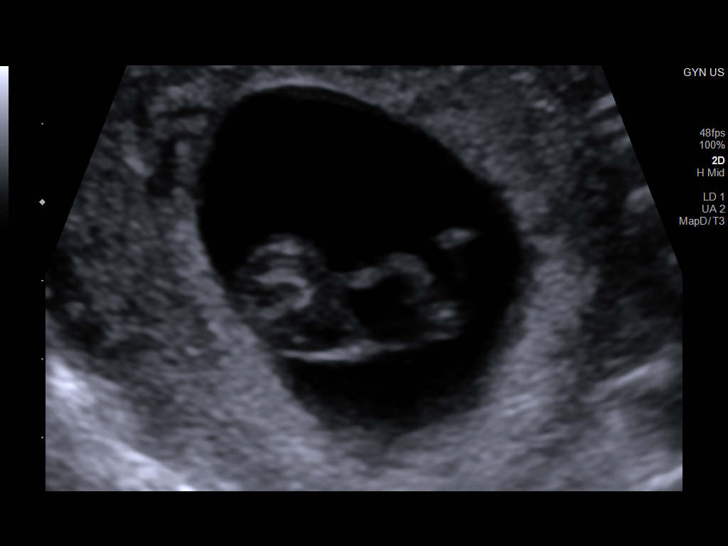
[im 76/76]
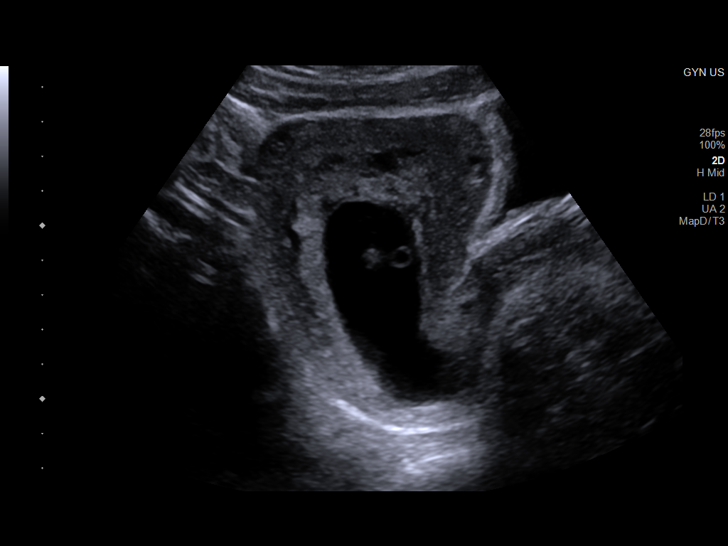

[14 of 28 positions shown; findings below may reference images not displayed]

FINDINGS: Intrauterine gestational sac: Single

Yolk sac:  Visualized.

Embryo:  Visualized.

Cardiac Activity: Visualized.

Heart Rate: 173 bpm

MSD:   mm    w     d

CRL:   30.0 mm   9 w 6 d                  US EDC: 02/11/2022

Subchorionic hemorrhage:  Moderate subchorionic hemorrhage

Maternal uterus/adnexae: No adnexal mass or free fluid.

Pulsed Doppler evaluation of both ovaries demonstrates normal
appearing low-resistance arterial and venous waveforms.
IMPRESSION: Nine week 6 day intrauterine pregnancy. Fetal heart rate 173 beats
per minute. Stable moderate subchorionic hemorrhage.

No evidence of ovarian torsion.

## 2023-12-31 ENCOUNTER — Other Ambulatory Visit: Payer: Self-pay

## 2023-12-31 ENCOUNTER — Emergency Department (HOSPITAL_COMMUNITY)
Admission: EM | Admit: 2023-12-31 | Discharge: 2023-12-31 | Disposition: A | Discharge status: Home / Self Care | Payer: Self-pay | Attending: Emergency Medicine | Admitting: Emergency Medicine

## 2023-12-31 ENCOUNTER — Encounter (HOSPITAL_COMMUNITY): Payer: Self-pay | Admitting: Emergency Medicine

## 2023-12-31 DIAGNOSIS — N390 Urinary tract infection, site not specified: Secondary | ICD-10-CM | POA: Diagnosis not present

## 2023-12-31 DIAGNOSIS — R1030 Lower abdominal pain, unspecified: Secondary | ICD-10-CM | POA: Diagnosis present

## 2023-12-31 LAB — COMPREHENSIVE METABOLIC PANEL WITH GFR
ALT: 31 U/L (ref 0–44)
AST: 25 U/L (ref 15–41)
Albumin: 3.8 g/dL (ref 3.5–5.0)
Alkaline Phosphatase: 83 U/L (ref 38–126)
Anion gap: 7 (ref 5–15)
BUN: 16 mg/dL (ref 6–20)
CO2: 23 mmol/L (ref 22–32)
Calcium: 8.7 mg/dL — ABNORMAL LOW (ref 8.9–10.3)
Chloride: 108 mmol/L (ref 98–111)
Creatinine, Ser: 1.01 mg/dL — ABNORMAL HIGH (ref 0.44–1.00)
GFR, Estimated: >60 mL/min (ref >60)
Glucose, Bld: 124 mg/dL — ABNORMAL HIGH (ref 70–99)
Potassium: 3.9 mmol/L (ref 3.5–5.1)
Sodium: 138 mmol/L (ref 135–145)
Total Bilirubin: 0.5 mg/dL (ref 0.0–1.2)
Total Protein: 7.2 g/dL (ref 6.5–8.1)

## 2023-12-31 LAB — CBC WITH DIFFERENTIAL/PLATELET
Abs Immature Granulocytes: 0.02 K/uL (ref 0.00–0.07)
Basophils Absolute: 0 K/uL (ref 0.0–0.1)
Basophils Relative: 1 %
Eosinophils Absolute: 0.3 K/uL (ref 0.0–0.5)
Eosinophils Relative: 4 %
HCT: 36.5 % (ref 36.0–46.0)
Hemoglobin: 11.8 g/dL — ABNORMAL LOW (ref 12.0–15.0)
Immature Granulocytes: 0 %
Lymphocytes Relative: 24 %
Lymphs Abs: 1.8 K/uL (ref 0.7–4.0)
MCH: 27.6 pg (ref 26.0–34.0)
MCHC: 32.3 g/dL (ref 30.0–36.0)
MCV: 85.5 fL (ref 80.0–100.0)
Monocytes Absolute: 0.8 K/uL (ref 0.1–1.0)
Monocytes Relative: 10 %
Neutro Abs: 4.6 K/uL (ref 1.7–7.7)
Neutrophils Relative %: 61 %
Platelets: 333 K/uL (ref 150–400)
RBC: 4.27 MIL/uL (ref 3.87–5.11)
RDW: 14.6 % (ref 11.5–15.5)
WBC: 7.5 K/uL (ref 4.0–10.5)
nRBC: 0 % (ref 0.0–0.2)

## 2023-12-31 LAB — URINALYSIS, ROUTINE W REFLEX MICROSCOPIC
Bilirubin Urine: NEGATIVE
Glucose, UA: NEGATIVE mg/dL
Hgb urine dipstick: NEGATIVE
Ketones, ur: NEGATIVE mg/dL
Nitrite: POSITIVE — AB
Protein, ur: 30 mg/dL — AB
Specific Gravity, Urine: 1.024 (ref 1.005–1.030)
pH: 6 (ref 5.0–8.0)

## 2023-12-31 LAB — LIPASE, BLOOD: Lipase: 37 U/L (ref 11–51)

## 2023-12-31 LAB — POC URINE PREG, ED: Preg Test, Ur: NEGATIVE

## 2023-12-31 MED ORDER — KETOROLAC TROMETHAMINE 30 MG/ML IJ SOLN
30.0000 mg | Freq: Once | INTRAMUSCULAR | Status: AC
  Administered 2023-12-31: 30 mg via INTRAMUSCULAR

## 2023-12-31 MED ORDER — ONDANSETRON HCL 4 MG/2ML IJ SOLN
4.0000 mg | Freq: Once | INTRAMUSCULAR | Status: AC
  Administered 2023-12-31: 4 mg via INTRAVENOUS
  Filled 2023-12-31: qty 2

## 2023-12-31 MED ORDER — PHENAZOPYRIDINE HCL 200 MG PO TABS: 200.0000 mg | ORAL_TABLET | Freq: Three times a day (TID) | ORAL | 0 refills | Status: DC

## 2023-12-31 MED ORDER — KETOROLAC TROMETHAMINE 30 MG/ML IJ SOLN
15.0000 mg | Freq: Once | INTRAMUSCULAR | Status: DC
Start: 2023-12-31 — End: 2023-12-31
  Filled 2023-12-31: qty 1

## 2023-12-31 MED ORDER — CEPHALEXIN 500 MG PO CAPS: 500.0000 mg | ORAL_CAPSULE | Freq: Two times a day (BID) | ORAL | 0 refills | Status: AC

## 2023-12-31 MED ORDER — CEPHALEXIN 500 MG PO CAPS
500.0000 mg | ORAL_CAPSULE | Freq: Once | ORAL | Status: AC
  Administered 2023-12-31: 500 mg via ORAL
  Filled 2023-12-31: qty 1

## 2023-12-31 NOTE — Discharge Instructions (Signed)
 Take the entire course of the antibiotics prescribed.  You may also use the Pyridium  prescribed to help you with your bladder pain.  Remember that this will turn your urine bright yellow-orange and is normal.  Make sure you are drinking plenty of fluids.  Get rechecked if you develop worse pain, fever or any new or worsening symptoms.

## 2023-12-31 NOTE — ED Triage Notes (Signed)
 Lower abdominal cramping that started a couple of days ago, denies any urinary s/s.

## 2023-12-31 NOTE — ED Provider Notes (Signed)
 [facility] AT [facility] Provider Note   CSN: [id number] Arrival date & time: 12/31/23  8175     Patient presents with: Abdominal Pain   Marissa Hicks is a 23 y.o. female with no significant past medical history presenting with a 2-day history of suprapubic discomfort described as cramping sensation which has been waxing and waning.  She denies fevers or chills, she has had some nausea but denies vomiting.  She denies painful urination but states she has been urinating more frequently than normal.  She denies diarrhea, constipation, flank pain.  Denies vaginal discharge.  She also took a home pregnancy test this week which was equivocal and finding as she states there was a very faint line on her home pregnancy test.   The history is provided by the patient.       Prior to Admission medications   Medication Sig Start Date End Date Taking? Authorizing Provider  cephALEXin  (KEFLEX ) 500 MG capsule Take 1 capsule (500 mg total) by mouth 2 (two) times daily for 7 days. 12/31/23 01/07/24 Yes Deklyn Gibbon, PA-C  phenazopyridine  (PYRIDIUM ) 200 MG tablet Take 1 tablet (200 mg total) by mouth 3 (three) times daily. 12/31/23  Yes Shekia Kuper, PA-C  HYDROcodone -acetaminophen  (NORCO) 5-325 MG tablet Take 1 tablet by mouth every 6 (six) hours as needed for moderate pain (pain score 4-6). 05/23/23   Sung, Jade J, MD  metoCLOPramide  (REGLAN ) 10 MG tablet Take 1 tablet (10 mg total) by mouth every 6 (six) hours. Patient not taking: Reported on 07/05/2021 06/23/21   Cleotilde Rogue, MD  metroNIDAZOLE  (FLAGYL ) 500 MG tablet Take 1 tablet (500 mg total) by mouth 2 (two) times daily. 05/23/23   Sung, Jade J, MD  ondansetron  (ZOFRAN -ODT) 4 MG disintegrating tablet Take 1 tablet (4 mg total) by mouth every 6 (six) hours as needed for nausea or vomiting. 08/08/23   Ward, Josette SAILOR, DO    Allergies: Latex    Review of Systems  Constitutional:  Negative for chills and fever.  HENT:   Negative for congestion and sore throat.   Eyes: Negative.   Respiratory:  Negative for chest tightness and shortness of breath.   Cardiovascular:  Negative for chest pain.  Gastrointestinal:  Positive for abdominal pain and nausea. Negative for vomiting.  Genitourinary:  Positive for frequency.  Musculoskeletal:  Negative for arthralgias, joint swelling and neck pain.  Skin: Negative.  Negative for rash and wound.  Neurological:  Negative for dizziness, weakness, light-headedness, numbness and headaches.  Psychiatric/Behavioral: Negative.      Updated Vital Signs BP 136/80   Pulse 70   Temp 98.5 F (36.9 C)   Resp (!) 22   LMP 12/02/2023 (Approximate)   SpO2 98%   Physical Exam Vitals and nursing note reviewed.  Constitutional:      Appearance: She is well-developed.  HENT:     Head: Normocephalic and atraumatic.  Eyes:     Conjunctiva/sclera: Conjunctivae normal.  Cardiovascular:     Rate and Rhythm: Normal rate and regular rhythm.     Heart sounds: Normal heart sounds.  Pulmonary:     Effort: Pulmonary effort is normal.     Breath sounds: Normal breath sounds. No wheezing.  Abdominal:     General: Bowel sounds are normal.     Palpations: Abdomen is soft.     Tenderness: There is abdominal tenderness in the suprapubic area. There is no right CVA tenderness, left CVA tenderness, guarding or rebound.  Musculoskeletal:        General: Normal range of motion.     Cervical back: Normal range of motion.  Skin:    General: Skin is warm and dry.  Neurological:     Mental Status: She is alert.     (all labs ordered are listed, but only abnormal results are displayed) Labs Reviewed  CBC WITH DIFFERENTIAL/PLATELET - Abnormal; Notable for the following components:      Result Value   Hemoglobin 11.8 (*)    All other components within normal limits  COMPREHENSIVE METABOLIC PANEL WITH GFR - Abnormal; Notable for the following components:   Glucose, Bld 124 (*)     Creatinine, Ser 1.01 (*)    Calcium 8.7 (*)    All other components within normal limits  URINALYSIS, ROUTINE W REFLEX MICROSCOPIC - Abnormal; Notable for the following components:   Protein, ur 30 (*)    Nitrite POSITIVE (*)    Leukocytes,Ua TRACE (*)    Bacteria, UA MANY (*)    All other components within normal limits  POC URINE PREG, ED - Normal  LIPASE, BLOOD    EKG: None  Radiology: No results found.   Procedures   Medications Ordered in the ED  ondansetron  (ZOFRAN ) injection 4 mg (4 mg Intravenous Given 12/31/23 1917)  cephALEXin  (KEFLEX ) capsule 500 mg (500 mg Oral Given 12/31/23 2107)  ketorolac  (TORADOL ) 30 MG/ML injection 30 mg (30 mg Intramuscular Given 12/31/23 2109)                                    Medical Decision Making Patient presenting with a 2-day history of low pelvic pain described as cramping but also associated with increased urinary frequency, no dysuria, no vomiting or fever but has had some mild intermittent nausea.  Differential diagnosis including UTI, kidney stones although she has no complaint of flank pain, pelvic infection/PID, denies vaginal discharge and low risk for STD.  she also took a home pregnancy test which was equivocal as she noted a fine faint line on her home test.  Her LMP was 12/02/2023 and normal.  Her pregnancy test here is negative today.  She does have a UTI which will be treated with antibiotics, she was also given a prescription of Pyridium  for symptom relief as well.  Return precautions were outlined.  Parent follow-up anticipated.  Amount and/or Complexity of Data Reviewed Labs: ordered.    Details: Labs reviewed including c-Met, CBC urinalysis, WBC count is normal at 7.5.  Urine pregnancy is negative.  She is positive for nitrites with many bacteria and 11-20 WBCs on her urine.  Risk Prescription drug management.        Final diagnoses:  Lower urinary tract infectious disease    ED Discharge Orders           Ordered    cephALEXin  (KEFLEX ) 500 MG capsule  2 times daily        12/31/23 2049    phenazopyridine  (PYRIDIUM ) 200 MG tablet  3 times daily        12/31/23 2049               Birdena Clarity, PA-C 12/31/23 2226    Freddi Hamilton, MD 12/31/23 2244

## 2024-01-02 LAB — POC URINE PREG, ED: Preg Test, Ur: NEGATIVE

## 2024-02-05 ENCOUNTER — Encounter: Payer: Self-pay | Admitting: Women's Health

## 2024-02-12 ENCOUNTER — Ambulatory Visit (INDEPENDENT_AMBULATORY_CARE_PROVIDER_SITE_OTHER): Admitting: Women's Health

## 2024-02-12 ENCOUNTER — Other Ambulatory Visit (HOSPITAL_COMMUNITY)
Admission: RE | Admit: 2024-02-12 | Discharge: 2024-02-12 | Disposition: A | Discharge status: Home / Self Care | Source: Ambulatory Visit | Attending: Women's Health | Admitting: Women's Health

## 2024-02-12 ENCOUNTER — Encounter: Payer: Self-pay | Admitting: Women's Health

## 2024-02-12 VITALS — BP 132/84 | HR 108 | Ht 65.0 in | Wt 210.8 lb

## 2024-02-12 DIAGNOSIS — Z113 Encounter for screening for infections with a predominantly sexual mode of transmission: Secondary | ICD-10-CM | POA: Insufficient documentation

## 2024-02-12 DIAGNOSIS — E282 Polycystic ovarian syndrome: Secondary | ICD-10-CM | POA: Diagnosis not present

## 2024-02-12 DIAGNOSIS — Z124 Encounter for screening for malignant neoplasm of cervix: Secondary | ICD-10-CM | POA: Insufficient documentation

## 2024-02-12 DIAGNOSIS — Z01419 Encounter for gynecological examination (general) (routine) without abnormal findings: Secondary | ICD-10-CM | POA: Diagnosis not present

## 2024-02-12 DIAGNOSIS — Z6835 Body mass index (BMI) 35.0-35.9, adult: Secondary | ICD-10-CM

## 2024-02-12 DIAGNOSIS — R300 Dysuria: Secondary | ICD-10-CM | POA: Diagnosis not present

## 2024-02-12 LAB — POCT URINALYSIS DIPSTICK OB
Glucose, UA: NEGATIVE
Ketones, UA: NEGATIVE
Leukocytes, UA: NEGATIVE
Nitrite, UA: NEGATIVE
POC,PROTEIN,UA: NEGATIVE

## 2024-02-12 NOTE — Patient Instructions (Signed)
 Diet for Polycystic Ovary Syndrome Polycystic ovary syndrome (PCOS) is a common hormonal disorder that affects a woman's reproductive system. It can cause problems with menstrual periods and make it hard to get and stay pregnant. Changing what you eat can help your hormones reach normal levels, improve your health, and help you better manage PCOS. Following a balanced diet can help you lose weight and improve the way that your body uses the hormone insulin to control blood sugar. This may include: Eating low-fat (lean) proteins, complex carbohydrates, fresh fruits and vegetables, low-fat dairy products, healthy fats, and fiber. Cutting down on calories. Exercising regularly. What are tips for following this plan? Follow a balanced diet for meals and snacks. Eat breakfast, lunch, dinner, and one or two snacks every day. Include protein in each meal and snack. Choose whole grains instead of products that are made with refined flour. Eat a variety of foods. Exercise regularly as told by your health care provider. Aim to do at least 30 minutes of exercise on most days of the week. If you are overweight or obese: Pay attention to how many calories you eat. Cutting down on calories can help you lose weight. Work with your health care provider or a dietitian to figure out how many calories you need each day. What foods should I eat?  Fruits Include a variety of colors and types. All fruits are helpful for PCOS. Vegetables Include a variety of colors and types. All vegetables are helpful for PCOS. Grains Whole grains, such as whole wheat. Whole-grain breads, crackers, cereals, and pasta. Unsweetened oatmeal. Bulgur, barley, quinoa, and brown rice. Tortillas made from corn or whole-wheat flour. Meats and other proteins Lean proteins, such as fish, chicken, beans, eggs, and tofu. Dairy Low-fat dairy products, such as skim milk, cheese sticks, and yogurt. Beverages Low-fat or fat-free drinks, such  as water, low-fat milk, sugar-free drinks, and small amounts of 100% fruit juice. Seasonings and condiments Ketchup. Mustard. Barbecue sauce. Relish. Low-fat or fat-free mayonnaise. Fats and oils Olive oil or canola oil. Walnuts and almonds. The items listed above may not be a complete list of recommended foods and beverages. Contact a dietitian for more options. What foods should I avoid? Foods that are high in calories or fat, especially saturated or trans fats. Fried foods. Sweets. Products that are made from refined white flour, including white bread, pastries, white rice, and pasta. The items listed above may not be a complete list of foods and beverages to avoid. Contact a dietitian for more information. Summary PCOS is a hormonal imbalance that affects a woman's reproductive system. It can cause problems with menstrual periods and make it hard to get and stay pregnant. You can help to manage your PCOS by exercising regularly and eating a healthy, varied diet of vegetables, fruit, whole grains, lean protein, and low-fat dairy products. Changing what you eat can improve the way that your body uses insulin, help your hormones reach normal levels, and help you lose weight. This information is not intended to replace advice given to you by your health care provider. Make sure you discuss any questions you have with your health care provider. Document Revised: 04/14/2023 Document Reviewed: 04/14/2023 Elsevier Patient Education  2024 [employer]  Polycystic Ovary Syndrome  Polycystic ovarian syndrome (PCOS) is a common hormonal disorder among women of reproductive age. In most women with PCOS, small fluid-filled sacs (cysts) grow on the ovaries. PCOS can cause problems with menstrual periods and make it hard to get and  stay pregnant. If this condition is not treated, it can lead to serious health problems, such as diabetes and heart disease. What are the causes? The cause of this condition is  not known. It may be due to certain factors, such as: Irregular menstrual cycle. High levels of certain hormones. Problems with the hormone that helps to control blood sugar (insulin). Certain genes. What increases the risk? You are more likely to develop this condition if you: Have a family history of PCOS or type 2 diabetes. Are overweight, eat unhealthy foods, and are not active. These factors may cause problems with blood sugar control, which can contribute to PCOS or PCOS symptoms. What are the signs or symptoms? Symptoms of this condition include: Ovarian cysts and sometimes pelvic pain. Menstrual periods that are not regular or are too heavy. Inability to get or stay pregnant. Increased growth of hair on the face, chest, stomach, back, thumbs, thighs, or toes. Acne or oily skin. Acne may develop during adulthood, and it may not get better with treatment. Weight gain or obesity. Patches of thickened and dark brown or black skin on the neck, arms, breasts, or thighs. How is this diagnosed? This condition is diagnosed based on: Your medical history. A physical exam that includes a pelvic exam. Your health care provider may look for areas of increased hair growth on your skin. Tests, such as: An ultrasound to check the ovaries for cysts and to view the lining of the uterus. Blood tests to check levels of sugar (glucose), female hormone (testosterone), and female hormones (estrogen and progesterone). How is this treated? There is no cure for this condition, but treatment can help to manage symptoms and prevent more health problems from developing. Treatment varies depending on your symptoms and if you want to have a baby or if you need birth control. Treatment may include: Making nutrition and lifestyle changes. Taking the progesterone hormone to start a menstrual period. Taking birth control pills to help you have regular menstrual periods. Taking medicines such as: Medicines to make  you ovulate, if you want to get pregnant. Medicine to reduce extra hair growth. Having surgery in severe cases. This may involve making small holes in one or both of your ovaries. This decreases the amount of testosterone that your body makes. Follow these instructions at home: Take over-the-counter and prescription medicines only as told by your health care provider. Follow a healthy meal plan that includes lean proteins, complex carbohydrates, fresh fruits and vegetables, low-fat dairy products, healthy fats, and fiber. If you are overweight, lose weight as told by your health care provider. Your health care provider can determine how much weight loss is best for you and can help you lose weight safely. Keep all follow-up visits. This is important. Contact a health care provider if: Your symptoms do not get better with medicine. Your symptoms get worse or you develop new symptoms. Summary Polycystic ovarian syndrome (PCOS) is a common hormonal disorder among women of reproductive age. PCOS can cause problems with menstrual periods and make it hard to get and stay pregnant. If this condition is not treated, it can lead to serious health problems, such as diabetes and heart disease. There is no cure for this condition, but treatment can help to manage symptoms and prevent more health problems from developing. This information is not intended to replace advice given to you by your health care provider. Make sure you discuss any questions you have with your health care provider. Document Revised: 04/14/2023  Document Reviewed: 04/14/2023 Elsevier Patient Education  2024 [employer].

## 2024-02-12 NOTE — Progress Notes (Signed)
 WELL-WOMAN EXAMINATION Patient name: BRIETTA MANSO MRN [id number]  Date of birth: July 06, 2000 Chief Complaint:   Gynecologic Exam (Burning pee)  History of Present Illness:   Marissa Hicks is a 23 y.o. G13P0020 African-American female being seen today for a routine well-woman exam. Trying to get pregnant x 54yr, partner 27yo and has never gotten anyone pregnant she's aware of. Periods q 1-5 months x4-5d. No female pattern hair growth or hair loss from head, no acne or dark spots neck/axilla. Had u/s in Dec that showed a lot of cysts.   PCP: [facility]      does not desire labs Patient's last menstrual period was 01/23/2024. The current method of family planning is none.  Last pap uncertain. Results were: neg per pt at [facility]. H/O abnormal pap: no Last mammogram: never (did have u/s for cyst). Results were: N/A. Family h/o breast cancer: no Last colonoscopy: never. Results were: N/A. Family h/o colorectal cancer: no     02/12/2024    1:37 PM  Depression screen PHQ 2/9  Decreased Interest 2  Down, Depressed, Hopeless 2  PHQ - 2 Score 4  Altered sleeping 3  Tired, decreased energy 3  Change in appetite 2  Feeling bad or failure about yourself  0  Trouble concentrating 1  Moving slowly or fidgety/restless 0  Suicidal thoughts 0  PHQ-9 Score 13        02/12/2024    1:37 PM  GAD 7 : Generalized Anxiety Score  Control/stop worrying 1  Worry too much - different things 2  Trouble relaxing 2  Restless 2  Easily annoyed or irritable 2  Afraid - awful might happen 0     Review of Systems:   Pertinent items are noted in HPI Denies any headaches, blurred vision, fatigue, shortness of breath, chest pain, abdominal pain, abnormal vaginal discharge/itching/odor/irritation, problems with periods, bowel movements, urination, or intercourse unless otherwise stated above. Pertinent History Reviewed:  Reviewed past medical,surgical, social and family history.  Reviewed problem list, medications  and allergies. Physical Assessment:   Vitals:   02/12/24 1323  BP: 132/84  Pulse: (!) 108  Weight: 210 lb 12.8 oz (95.6 kg)  Height: 5' 5 (1.651 m)  Body mass index is 35.08 kg/m.        Physical Examination:   General appearance - well appearing, and in no distress  Mental status - alert, oriented to person, place, and time  Psych:  She has a normal mood and affect  Skin - warm and dry, normal color, no suspicious lesions noted  Chest - effort normal, all lung fields clear to auscultation bilaterally  Heart - normal rate and regular rhythm  Neck:  midline trachea, no thyromegaly or nodules  Breasts - breasts appear normal, no suspicious masses, no skin or nipple changes or  axillary nodes  Abdomen - soft, nontender, nondistended, no masses or organomegaly  Pelvic - VULVA: normal appearing vulva with no masses, tenderness or lesions  VAGINA: normal appearing vagina with normal color and discharge, no lesions  CERVIX: normal appearing cervix without discharge or lesions, no CMT  Thin prep pap is done w/ HR HPV cotesting  UTERUS: uterus is felt to be normal size, shape, consistency and nontender   ADNEXA: No adnexal masses or tenderness noted.  Extremities:  No swelling or varicosities noted  Chaperone: Winton Cherry  Results for orders placed or performed in visit on 02/12/24 (from the past 24 hours)  POC Urinalysis Dipstick OB  Collection Time: 02/12/24  2:07 PM  Result Value Ref Range   Color, UA     Clarity, UA     Glucose, UA Negative Negative   Bilirubin, UA     Ketones, UA negative    Spec Grav, UA     Blood, UA trace    pH, UA     POC,PROTEIN,UA Negative Negative, Trace, Small (1+), Moderate (2+), Large (3+), 4+   Urobilinogen, UA     Nitrite, UA negative    Leukocytes, UA Negative Negative   Appearance     Odor      Assessment & Plan:  1) Well-Woman Exam  2) PCOS> based on irregular periods and pelvic u/s from Dec 2024  3) Trying to conceive> x 22yr,  advised weight loss to see if will start ovulating on her own, offered dietician referral, wants, order placed. F/U in , will start provera/femara if needed  4) Dysuria> send urine cx  5) STD screen> CV swab  Labs/procedures today: pap, urine cx, CV swab  Mammogram: @ 25yo, or sooner if problems Colonoscopy: @ 25yo, or sooner if problems  Orders Placed This Encounter  Procedures   Urine Culture   Amb ref to Medical Nutrition Therapy-MNT   POC Urinalysis Dipstick OB    Meds: No orders of the defined types were placed in this encounter.   Follow-up: Return in about 3 months (around 05/13/2024) for GYN f/u, in person.  Suzen JONELLE Fetters CNM, [facility] 02/12/2024 2:09 PM

## 2024-02-14 LAB — URINE CULTURE

## 2024-02-16 ENCOUNTER — Ambulatory Visit: Payer: Self-pay | Admitting: Women's Health

## 2024-02-17 ENCOUNTER — Other Ambulatory Visit: Payer: Self-pay | Admitting: Women's Health

## 2024-02-17 LAB — CERVICOVAGINAL ANCILLARY ONLY
Bacterial Vaginitis (gardnerella): POSITIVE — AB
Candida Glabrata: NEGATIVE
Candida Vaginitis: NEGATIVE
Chlamydia: NEGATIVE
Comment: NEGATIVE
Comment: NEGATIVE
Comment: NEGATIVE
Comment: NEGATIVE
Comment: NEGATIVE
Comment: NORMAL
Neisseria Gonorrhea: NEGATIVE
Trichomonas: NEGATIVE

## 2024-02-17 LAB — CYTOLOGY - PAP
Comment: NEGATIVE
Diagnosis: NEGATIVE
High risk HPV: NEGATIVE

## 2024-02-17 MED ORDER — METRONIDAZOLE 500 MG PO TABS: 500.0000 mg | ORAL_TABLET | Freq: Two times a day (BID) | ORAL | 0 refills | Status: DC

## 2024-02-18 ENCOUNTER — Ambulatory Visit: Admitting: Nutrition

## 2024-03-01 ENCOUNTER — Encounter: Admitting: Women's Health

## 2024-03-04 ENCOUNTER — Encounter: Payer: Self-pay | Admitting: Women's Health

## 2024-03-11 ENCOUNTER — Encounter: Attending: Women's Health | Admitting: Nutrition

## 2024-03-11 DIAGNOSIS — E282 Polycystic ovarian syndrome: Secondary | ICD-10-CM | POA: Diagnosis present

## 2024-03-11 NOTE — Progress Notes (Signed)
 Medical Nutrition Therapy  Appointment Start time:  [phone]  Appointment End time:  1600  Primary concerns today: PCOS  Referral diagnosis: E28.2 Preferred learning style: No preference  Learning readiness: Ready   NUTRITION ASSESSMENT  23 yr old bfemale referred for PCOS. Wanting to get pregnant. PMH: Obesity. She doesn't have a PCP. Provided current practices in the area taking new patients. Stressed the need for a PCP to address overall health.  Not exercising. Has some knee issues. Eats out often. Eats 1-2 meals per day Current diet is high in processed foods that is high in sodium and fat and low in fruits, vegetables and whole grains. Her eating habits most likely are causing insulin resistance and putting her at risk for prediabetes/diabetes and hormonal imbalance with PCOS. No A1C, or lipid profile for review.  She notes she is willing to make lifestyle changes with her diet and activity to obtain desired weight loss and improve her PCOS.  Clinical    Latest Ref Rng & Units 12/31/2023    7:26 PM 08/20/2023   10:02 PM 08/08/2023    5:32 AM  CMP  Glucose 70 - 99 mg/dL 875  895  892   BUN 6 - 20 mg/dL 16  11  14    Creatinine 0.44 - 1.00 mg/dL 8.98  9.14  9.35   Sodium 135 - 145 mmol/L 138  140  140   Potassium 3.5 - 5.1 mmol/L 3.9  4.1  3.5   Chloride 98 - 111 mmol/L 108  107  108   CO2 22 - 32 mmol/L 23  25  21    Calcium 8.9 - 10.3 mg/dL 8.7  8.9  9.2   Total Protein 6.5 - 8.1 g/dL 7.2   8.1   Total Bilirubin 0.0 - 1.2 mg/dL 0.5   0.9   Alkaline Phos 38 - 126 U/L 83   83   AST 15 - 41 U/L 25   23   ALT 0 - 44 U/L 31   34    Lipid Panel  No results found for: CHOL, TRIG, HDL, CHOLHDL, VLDL, LDLCALC, LDLDIRECT, LABVLDL No results found for: YHAJ8R  Medical Hx: No past medical history on file.  Medications: .none Labs:     Latest Ref Rng & Units 12/31/2023    7:26 PM 08/20/2023   10:02 PM 08/08/2023    5:32 AM  CMP  Glucose 70 - 99 mg/dL 875  895   892   BUN 6 - 20 mg/dL 16  11  14    Creatinine 0.44 - 1.00 mg/dL 8.98  9.14  9.35   Sodium 135 - 145 mmol/L 138  140  140   Potassium 3.5 - 5.1 mmol/L 3.9  4.1  3.5   Chloride 98 - 111 mmol/L 108  107  108   CO2 22 - 32 mmol/L 23  25  21    Calcium 8.9 - 10.3 mg/dL 8.7  8.9  9.2   Total Protein 6.5 - 8.1 g/dL 7.2   8.1   Total Bilirubin 0.0 - 1.2 mg/dL 0.5   0.9   Alkaline Phos 38 - 126 U/L 83   83   AST 15 - 41 U/L 25   23   ALT 0 - 44 U/L 31   34     Notable Signs/Symptoms: Knee issues  Lifestyle & Dietary Hx Lives with her mom Eats most meals at home and away from home No working at present.  Estimated daily fluid intake: 120+  oz Supplements: none Sleep: Poor sleep; 5 hrs Stress / self-care: Knee issues. Current average weekly physical activity: ADL  24-Hr Dietary Recall 069  bacon 4 slices 6 pm [place] chicken, rice 1 cup of rice, veggies, shrimp, water  Most of time eats 2 meals per day No fruit daily Not many vegetables per day Protein source: chicken, beef   Estimated Energy Needs Calories: 1200 Carbohydrate: 135g Protein: 90g Fat: 33g   NUTRITION DIAGNOSIS  [place]-3.3 Overweight/obesity As related to high calorie diet from fat, CHO and salt intake.  As evidenced by Diet recall and BMI of 35.   NUTRITION INTERVENTION  Nutrition education (E-1) on the following topics:  PCOS- signs, symptoms and causes. Causes of weight gain, nutrient dense foods vs empty calorie foods. Lifestyle Medicine  - Whole Food, Plant Predominant Nutrition is highly recommended: Eat Plenty of vegetables, Mushrooms, fruits, Legumes, Whole Grains, Nuts, seeds in lieu of processed meats, processed snacks/pastries red meat, poultry, eggs.    -It is better to avoid simple carbohydrates including: Cakes, Sweet Desserts, Ice Cream, Soda (diet and regular), Sweet Tea, Candies, Chips, Cookies, Store Bought Juices, Alcohol in Excess of  1-2 drinks a day, Lemonade,  Artificial Sweeteners,  Doughnuts, Coffee Creamers, Sugar-free Products, etc, etc.  This is not a complete list.....  Exercise: If you are able: 30 -60 minutes a day ,4 days a week, or 150 minutes a week.  The longer the better.  Combine stretch, strength, and aerobic activities.  If you were told in the past that you have high risk for cardiovascular diseases, you may seek evaluation by your heart doctor prior to initiating moderate to intense exercise programs.   Handouts Provided Include  Lifestyle Medicine Handouts My Plate Emotional eating  Learning Style & Readiness for Change Teaching method utilized: Visual & Auditory  Demonstrated degree of understanding via: Teach Back  Barriers to learning/adherence to lifestyle change: none  Goals Established by Pt Goals  Eat three meals per day at times discussed; B 8 am L)12-2 D) 5-7. Increase to eating 3 servings of fruit per day Eat 2 servings of vegetables with lunch and dinner daily Get Lingo online and use it evaluate blood sugar control for improved ovulation; Schedule appointment with a PCP.   MONITORING & EVALUATION Dietary intake, weekly physical activity, and weight in 12 month.  Next Steps  Patient is to work on eating 3 healthier meals consistently.SABRA

## 2024-03-11 NOTE — Patient Instructions (Signed)
 Goals  Eat three meals per day at times discussed; B 8 am L)12-2 D) 5-7. Increase to eating 3 servings of fruit per day Eat 2 servings of vegetables with lunch and dinner daily Get Lingo online and use it evaluate blood sugar control for improved ovulation; Schedule appointment with a PCP.

## 2024-03-29 ENCOUNTER — Encounter: Payer: Self-pay | Admitting: Nutrition

## 2024-04-14 ENCOUNTER — Encounter: Attending: Women's Health | Admitting: Nutrition

## 2024-04-14 DIAGNOSIS — E282 Polycystic ovarian syndrome: Secondary | ICD-10-CM | POA: Insufficient documentation

## 2024-04-26 ENCOUNTER — Encounter: Payer: Self-pay | Admitting: Women's Health

## 2024-04-27 ENCOUNTER — Encounter: Payer: Self-pay | Admitting: Adult Health

## 2024-04-27 ENCOUNTER — Ambulatory Visit: Admitting: Adult Health

## 2024-04-27 VITALS — BP 119/83 | HR 95 | Ht 65.0 in | Wt 215.5 lb

## 2024-04-27 DIAGNOSIS — E282 Polycystic ovarian syndrome: Secondary | ICD-10-CM | POA: Diagnosis not present

## 2024-04-27 DIAGNOSIS — N911 Secondary amenorrhea: Secondary | ICD-10-CM | POA: Insufficient documentation

## 2024-04-27 DIAGNOSIS — Z3202 Encounter for pregnancy test, result negative: Secondary | ICD-10-CM

## 2024-04-27 LAB — POCT URINE PREGNANCY: Preg Test, Ur: NEGATIVE

## 2024-04-27 MED ORDER — MEDROXYPROGESTERONE ACETATE 10 MG PO TABS: 10.0000 mg | ORAL_TABLET | Freq: Every day | ORAL | 0 refills | Status: DC

## 2024-04-27 NOTE — Progress Notes (Signed)
  Subjective:     Patient ID: SRUTI AYLLON, female   DOB: [birth year], 23 y.o.   MRN: [id number]  HPI Kwana is a 23 year old black female,single, G2P0020 in complaining of no period since August. She had faint HPT x 1 then 3 negative HPTs.     Component Value Date/Time   DIAGPAP  02/12/2024 1402    - Negative for intraepithelial lesion or malignancy (NILM)   HPVHIGH Negative 02/12/2024 1402   ADEQPAP Satisfactory for evaluation.  The absence of an 02/12/2024 1402   ADEQPAP  02/12/2024 1402    endocervical/transformation zone component is not uncommon in pregnant   ADEQPAP patients. 02/12/2024 1402     Review of Systems No period in 3 months Has gained some weight  Reviewed past medical,surgical, social and family history. Reviewed medications and allergies.     Objective:   Physical Exam BP 119/83 (BP Location: Left Arm, Patient Position: Sitting, Cuff Size: Large)   Pulse 95   Ht 5' 5 (1.651 m)   Wt 215 lb 8 oz (97.8 kg)   LMP 01/23/2024 (Approximate)   Breastfeeding No   BMI 35.86 kg/m  UPT is negative    Skin warm and dry. Lungs: clear to ausculation bilaterally. Cardiovascular: regular rate and rhythm.   Upstream - 04/27/24 1113       Pregnancy Intention Screening   Does the patient want to become pregnant in the next year? Yes    Does the patient's partner want to become pregnant in the next year? Yes    Would the patient like to discuss contraceptive options today? No      Contraception Wrap Up   Current Method Pregnant/Seeking Pregnancy    End Method Pregnant/Seeking Pregnancy    Contraception Counseling Provided No          Assessment:     1. Negative pregnancy test - POCT urine pregnancy  2. Amenorrhea, secondary (Primary) No period in 3 months UPT is negative Will rx provera 10 mg 1 daily for 10 days to see if can get withdrawal bleed Meds ordered this encounter  Medications   medroxyPROGESTERone (PROVERA) 10 MG tablet    Sig: Take 1  tablet (10 mg total) by mouth daily. X 10 days    Dispense:  10 tablet    Refill:  0    Supervising Provider:   JAYNE, LUTHER H [[id number]]     3. PCOS (polycystic ovarian syndrome) Try to lose about 20 lbs     Plan:     Has appt 05/10/24 with Luke Fetters CNM already, keep that appt

## 2024-05-05 ENCOUNTER — Ambulatory Visit: Admission: EM | Admit: 2024-05-05 | Discharge: 2024-05-05 | Disposition: A | Discharge status: Home / Self Care

## 2024-05-10 ENCOUNTER — Ambulatory Visit: Admitting: Women's Health

## 2024-05-10 ENCOUNTER — Encounter: Payer: Self-pay | Admitting: Women's Health

## 2024-05-10 VITALS — BP 132/89 | HR 88 | Ht 65.0 in | Wt 214.0 lb

## 2024-05-10 DIAGNOSIS — Z319 Encounter for procreative management, unspecified: Secondary | ICD-10-CM

## 2024-05-10 DIAGNOSIS — E282 Polycystic ovarian syndrome: Secondary | ICD-10-CM

## 2024-05-10 DIAGNOSIS — Z3202 Encounter for pregnancy test, result negative: Secondary | ICD-10-CM

## 2024-05-10 LAB — POCT URINE PREGNANCY: Preg Test, Ur: NEGATIVE

## 2024-05-10 MED ORDER — MEDROXYPROGESTERONE ACETATE 10 MG PO TABS: 20.0000 mg | ORAL_TABLET | Freq: Every day | ORAL | 0 refills | Status: DC

## 2024-05-10 NOTE — Progress Notes (Signed)
 GYN VISIT Patient name: Marissa Hicks MRN [id number]  Date of birth: 2000-10-30 Chief Complaint:   Follow-up  History of Present Illness:   Marissa Hicks is a 23 y.o. G67P0020 African-American female being seen today for f/u on PCOS/wanting to conceive. Had 1 + HPT in Nov then 3 neg and neg here. LMP 8/15. Jennifer rx'd provera  10mg  x 10d, pt states did not bleed. Met w/ dietician after our visit in Sept where we discussed importance of weight loss w/ PCOS. Hasn't made any changes in diet. Has gained weight.     Patient's last menstrual period was 01/23/2024 (approximate). The current method of family planning is none.  Last pap 02/12/24. Results were: NILM w/ HRHPV negative     03/11/2024    2:58 PM 02/12/2024    1:37 PM  Depression screen PHQ 2/9  Decreased Interest 1 2  Down, Depressed, Hopeless 1 2  PHQ - 2 Score 2 4  Altered sleeping 3 3  Tired, decreased energy 2 3  Change in appetite 2 2  Feeling bad or failure about yourself  0 0  Trouble concentrating 0 1  Moving slowly or fidgety/restless 0 0  Suicidal thoughts 0 0  PHQ-9 Score 9  13      Data saved with a previous flowsheet row definition        02/12/2024    1:37 PM  GAD 7 : Generalized Anxiety Score  Control/stop worrying 1  Worry too much - different things 2  Trouble relaxing 2  Restless 2  Easily annoyed or irritable 2  Afraid - awful might happen 0     Review of Systems:   Pertinent items are noted in HPI Denies fever/chills, dizziness, headaches, visual disturbances, fatigue, shortness of breath, chest pain, abdominal pain, vomiting, abnormal vaginal discharge/itching/odor/irritation, problems with periods, bowel movements, urination, or intercourse unless otherwise stated above.  Pertinent History Reviewed:  Reviewed past medical,surgical, social, obstetrical and family history.  Reviewed problem list, medications and allergies. Physical Assessment:   Vitals:   05/10/24 0923  BP: 132/89   Pulse: 88  Weight: 214 lb (97.1 kg)  Height: 5' 5 (1.651 m)  Body mass index is 35.61 kg/m.       Physical Examination:   General appearance: alert, well appearing, and in no distress  Mental status: alert, oriented to person, place, and time  Skin: warm & dry   Cardiovascular: normal heart rate noted  Respiratory: normal respiratory effort, no distress  Abdomen: soft, non-tender   Pelvic: examination not indicated  Extremities: no edema   Chaperone: N/A  Results for orders placed or performed in visit on 05/10/24 (from the past 24 hours)  POCT urine pregnancy   Collection Time: 05/10/24  9:42 AM  Result Value Ref Range   Preg Test, Ur Negative Negative    Assessment & Plan:  1) PCOS trying to conceive, BMI 35> met w/ dietician in Oct, has gained 4lbs since our Sept visit. Again discussed importance of weight loss w/ PCOS/ovulation. Recommended decreasing carbs/increasing protein, increasing activity. Rx provera  20mg  x 10d (failed 10mg  x10d), send me MyChart message to let me know if bleeds/or not. If bleeds, will continue monthly if no spontaneous period and not pregnancy, and add femara if needed. Continue pnv. F/U .   Meds:  Meds ordered this encounter  Medications   medroxyPROGESTERone  (PROVERA ) 10 MG tablet    Sig: Take 2 tablets (20 mg total) by mouth daily. X 10 days  Dispense:  20 tablet    Refill:  0    Orders Placed This Encounter  Procedures   POCT urine pregnancy    Return in about 3 months (around 08/08/2024) for gyn f/u in person w/ me.  Suzen JONELLE Fetters CNM, [facility] 05/10/2024 9:48 AM

## 2024-05-18 ENCOUNTER — Encounter: Payer: Self-pay | Admitting: Women's Health

## 2024-06-01 ENCOUNTER — Other Ambulatory Visit: Payer: Self-pay | Admitting: Women's Health

## 2024-06-01 MED ORDER — LETROZOLE 2.5 MG PO TABS: 2.5000 mg | ORAL_TABLET | Freq: Every day | ORAL | 2 refills | Status: AC

## 2024-06-14 ENCOUNTER — Ambulatory Visit

## 2024-06-14 VITALS — BP 124/81 | HR 81 | Temp 97.7°F | Ht 65.0 in | Wt 215.0 lb

## 2024-06-14 DIAGNOSIS — Z13220 Encounter for screening for lipoid disorders: Secondary | ICD-10-CM

## 2024-06-14 DIAGNOSIS — R739 Hyperglycemia, unspecified: Secondary | ICD-10-CM | POA: Diagnosis not present

## 2024-06-14 DIAGNOSIS — E282 Polycystic ovarian syndrome: Secondary | ICD-10-CM

## 2024-06-14 DIAGNOSIS — Z0000 Encounter for general adult medical examination without abnormal findings: Secondary | ICD-10-CM

## 2024-06-14 DIAGNOSIS — R5383 Other fatigue: Secondary | ICD-10-CM

## 2024-06-14 DIAGNOSIS — Z7689 Persons encountering health services in other specified circumstances: Secondary | ICD-10-CM

## 2024-06-14 DIAGNOSIS — L83 Acanthosis nigricans: Secondary | ICD-10-CM | POA: Diagnosis not present

## 2024-06-14 DIAGNOSIS — G479 Sleep disorder, unspecified: Secondary | ICD-10-CM

## 2024-06-14 DIAGNOSIS — Z0001 Encounter for general adult medical examination with abnormal findings: Secondary | ICD-10-CM | POA: Diagnosis not present

## 2024-06-14 DIAGNOSIS — Z113 Encounter for screening for infections with a predominantly sexual mode of transmission: Secondary | ICD-10-CM

## 2024-06-14 MED ORDER — METFORMIN HCL ER 500 MG PO TB24: 500.0000 mg | ORAL_TABLET | Freq: Every day | ORAL | 5 refills | Status: AC

## 2024-06-14 NOTE — Progress Notes (Signed)
 "  New Patient Office Visit  Subjective    Patient ID: Marissa Hicks, female    DOB: 01/28/2001  Age: 24 y.o. MRN: [id number]  HPI Marissa Hicks presents to establish care and for annual physical.   The patient comes in today for a wellness visit.  A review of their health history was completed. A review of medications was also completed.  Any needed refills; No  Eating habits: Okay, patient admits to snacking frequently on unhealthy things.   Falls/  MVA accidents in past few months: No  Regular exercise: Yes, has been walked two [place] the track.   Sleep: Poor, says that she struggles to fall asleep and stay asleep. Admits to cell phone use before bed.   Menstrual cycles/sexual history: Has a history of PCOS and is currently trying to get pregnant. Is on provera  and femara  to have a cycle. Medications have been working, and patient is able to have a cycle on these medications. Sexually active with 1 female partner.   Specialist pt sees on regular basis: Gynecology  Regular eye/dental exams: Yes  Preventative health issues were discussed.   Additional concerns: Is concerned about weight gain and thinks she may be prediabetic. Injured her left knee at work within the last year, and had surgery. Has not been able to be as physically active due to this injury. Has increased walking to twice per week at the track. Says when she walks on the road it bothers her knee.   History reviewed. No pertinent past medical history.  Past Surgical History:  Procedure Laterality Date   KNEE SURGERY Left 08/2023    Family History  Problem Relation Age of Onset   Diabetes Maternal Grandmother    Hypertension Maternal Grandmother    Hypertension Maternal Grandfather     Social History   Socioeconomic History   Marital status: Single    Spouse name: Not on file   Number of children: Not on file   Years of education: Not on file   Highest education level: Not on file   Occupational History   Not on file  Tobacco Use   Smoking status: Never   Smokeless tobacco: Never  Vaping Use   Vaping status: Never Used  Substance and Sexual Activity   Alcohol use: Yes    Comment: occ   Drug use: No   Sexual activity: Yes    Birth control/protection: None  Other Topics Concern   Not on file  Social History Narrative   Not on file   Social Drivers of Health   Tobacco Use: Low Risk (06/14/2024)   Patient History    Smoking Tobacco Use: Never    Smokeless Tobacco Use: Never    Passive Exposure: Not on file  Financial Resource Strain: Not on file  Food Insecurity: No Food Insecurity (02/12/2024)   Epic    Worried About Programme Researcher, Broadcasting/film/video in the Last Year: Never true    Ran Out of Food in the Last Year: Never true  Transportation Needs: No Transportation Needs (02/12/2024)   Epic    Lack of Transportation (Medical): No    Lack of Transportation (Non-Medical): No  Physical Activity: Insufficiently Active (02/12/2024)   Exercise Vital Sign    Days of Exercise per Week: 5 days    Minutes of Exercise per Session: 20 min  Stress: Stress Concern Present (02/12/2024)   [employer] of Occupational Health - Occupational Stress Questionnaire    Feeling  of Stress: Rather much  Social Connections: Socially Isolated (02/12/2024)   Social Connection and Isolation Panel    Frequency of Communication with Friends and Family: Once a week    Frequency of Social Gatherings with Friends and Family: Once a week    Attends Religious Services: 1 to 4 times per year    Active Member of [employer] or Organizations: No    Attends Banker Meetings: Never    Marital Status: Never married  Intimate Partner Violence: Not At Risk (02/12/2024)   Epic    Fear of Current or Ex-Partner: No    Emotionally Abused: No    Physically Abused: No    Sexually Abused: No  Depression (PHQ2-9): Medium Risk (06/14/2024)   Depression (PHQ2-9)    PHQ-2 Score: 6  Alcohol Screen: Low Risk  (02/12/2024)   Alcohol Screen    Last Alcohol Screening Score (AUDIT): 4  Housing: Unknown (02/12/2024)   Epic    Unable to Pay for Housing in the Last Year: No    Number of Times Moved in the Last Year: Not on file    Homeless in the Last Year: No  Utilities: Not on file  Health Literacy: Not on file    Review of Systems  Constitutional:  Positive for fatigue. Negative for appetite change, fever and unexpected weight change.  HENT:  Negative for trouble swallowing.   Eyes:  Negative for visual disturbance.  Respiratory:  Negative for cough, chest tightness, shortness of breath and wheezing.   Cardiovascular:  Negative for chest pain, palpitations and leg swelling.  Gastrointestinal:  Negative for constipation, diarrhea, nausea and vomiting.  Genitourinary:  Negative for difficulty urinating and menstrual problem.  Neurological:  Negative for headaches.  Psychiatric/Behavioral:  Negative for sleep disturbance. The patient is not nervous/anxious.     Objective    BP 124/81   Pulse 81   Temp 97.7 F (36.5 C)   Ht 5' 5 (1.651 m)   Wt 215 lb (97.5 kg)   SpO2 98%   BMI 35.78 kg/m   Physical Exam Vitals and nursing note reviewed.  Constitutional:      General: She is not in acute distress.    Appearance: Normal appearance. She is obese. She is not ill-appearing or toxic-appearing.  Neck:     Thyroid: No thyroid mass, thyromegaly or thyroid tenderness.  Cardiovascular:     Rate and Rhythm: Normal rate and regular rhythm.     Heart sounds: Normal heart sounds, S1 normal and S2 normal. No murmur heard. Pulmonary:     Effort: Pulmonary effort is normal. No respiratory distress.     Breath sounds: Normal breath sounds. No wheezing.  Abdominal:     General: There is no distension.     Palpations: Abdomen is soft.     Tenderness: There is no abdominal tenderness.     Hernia: No hernia is present.  Musculoskeletal:     Right lower leg: No edema.     Left lower leg: No edema.   Lymphadenopathy:     Cervical: No cervical adenopathy.  Skin:    General: Skin is warm and dry.     Comments: Positive for acanthosis nigricans on the neck.   Neurological:     Mental Status: She is alert. Mental status is at baseline.  Psychiatric:        Mood and Affect: Mood normal.        Behavior: Behavior normal.  Thought Content: Thought content normal.        Judgment: Judgment normal.       06/14/2024   10:15 AM 03/11/2024    2:58 PM 02/12/2024    1:37 PM  Depression screen PHQ 2/9  Decreased Interest 1 1 2   Down, Depressed, Hopeless 1 1 2   PHQ - 2 Score 2 2 4   Altered sleeping 1 3 3   Tired, decreased energy 1 2 3   Change in appetite 1 2 2   Feeling bad or failure about yourself  1 0 0  Trouble concentrating 0 0 1  Moving slowly or fidgety/restless 0 0 0  Suicidal thoughts 0 0 0  PHQ-9 Score 6 9  13    Difficult doing work/chores Somewhat difficult       Data saved with a previous flowsheet row definition       06/14/2024   10:15 AM 02/12/2024    1:37 PM  GAD 7 : Generalized Anxiety Score  Nervous, Anxious, on Edge 1   Control/stop worrying 0 1  Worry too much - different things 1 2  Trouble relaxing 0 2  Restless 0 2  Easily annoyed or irritable 1 2  Afraid - awful might happen 0 0  Total GAD 7 Score 3   Anxiety Difficulty Not difficult at all    Assessment & Plan:  1. Encounter to establish care  2. Well woman exam without gynecological exam (Primary) Adult wellness-complete.wellness physical was conducted today. Importance of diet and exercise were discussed in detail.  Importance of stress reduction and healthy living were discussed.  In addition to this a discussion regarding safety was also covered.  We also reviewed over immunizations and gave recommendations regarding current immunization needed for age.   In addition to this additional areas were also touched on including: Preventative health exams needed: None.  Patient was advised yearly  wellness exam   3. Acanthosis nigricans -Acanthosis nigricans noted on exam. Discussed with patient that this finding is commonly associated with insulin resistance and may be an early sign of abnormal glucose metabolism. Discussed relationship between insulin resistance and fertility. Patient agreeable to screening. Will obtain hemoglobin A1C to evaluate for diabetes or prediabetes.  -Provided education regarding lifestyle modifications including diet, physical activity, and weight management. Will follow up with results in MyChart.  - Hemoglobin A1c  4. PCOS (polycystic ovarian syndrome) -Discussed pathophysiology including insulin resistance contributing to hyperandrogenism and menstrual irregularity. Initiated metformin  to improve insulin sensitivity, support menstrual regulation, and reduce metabolic risk. Patient agreeable.  -Educated patient on appropriate use and potential side effects.   -metFORMIN  (GLUCOPHAGE -XR) 500 MG 24 hr tablet; Take 1 tablet (500 mg total) by mouth daily with breakfast.  Dispense: 30 tablet; Refill: 5  5. Fatigue, unspecified type - TSH + free T4 - CBC with Differential  6. Screening for lipid disorders -Educated patient about incorporating diet changes such as minimizing fried, high fat, or processed foods in diet. Advised patient to increase exercise as tolerated to help increase good cholesterol levels.   - Lipid panel  7. Elevated random blood glucose level - CMP14+EGFR - Hemoglobin A1c  8. Sleep disturbance -Educated patient regarding sleep hygiene such as minimizing caffeine intake and screen time before bed. Would like to implement sleep hygiene techniques and revisit this in 6 months. Patient to follow-up if further issues with this.   9. Screening for STD (sexually transmitted disease) - Hepatitis C antibody   Return in about 6 months (around 12/12/2024).  Damien KATHEE Pringle, FNP  "

## 2024-06-15 LAB — HEMOGLOBIN A1C
Est. average glucose Bld gHb Est-mCnc: 108 mg/dL
Hgb A1c MFr Bld: 5.4 % (ref 4.8–5.6)

## 2024-06-15 LAB — CBC WITH DIFFERENTIAL/PLATELET
Basophils Absolute: 0.1 x10E3/uL (ref 0.0–0.2)
Basos: 1 %
EOS (ABSOLUTE): 0.2 x10E3/uL (ref 0.0–0.4)
Eos: 4 %
Hematocrit: 44.4 % (ref 34.0–46.6)
Hemoglobin: 13.4 g/dL (ref 11.1–15.9)
Immature Grans (Abs): 0 x10E3/uL (ref 0.0–0.1)
Immature Granulocytes: 0 %
Lymphocytes Absolute: 1.8 x10E3/uL (ref 0.7–3.1)
Lymphs: 31 %
MCH: 26.4 pg — ABNORMAL LOW (ref 26.6–33.0)
MCHC: 30.2 g/dL — ABNORMAL LOW (ref 31.5–35.7)
MCV: 87 fL (ref 79–97)
Monocytes Absolute: 0.5 x10E3/uL (ref 0.1–0.9)
Monocytes: 9 %
Neutrophils Absolute: 3.1 x10E3/uL (ref 1.4–7.0)
Neutrophils: 54 %
Platelets: 370 x10E3/uL (ref 150–450)
RBC: 5.08 x10E6/uL (ref 3.77–5.28)
RDW: 13.9 % (ref 11.7–15.4)
WBC: 5.7 x10E3/uL (ref 3.4–10.8)

## 2024-06-15 LAB — CMP14+EGFR
ALT: 25 IU/L (ref 0–32)
AST: 21 IU/L (ref 0–40)
Albumin: 4.6 g/dL (ref 4.0–5.0)
Alkaline Phosphatase: 105 IU/L (ref 41–116)
BUN/Creatinine Ratio: 14 (ref 9–23)
BUN: 11 mg/dL (ref 6–20)
Bilirubin Total: 0.4 mg/dL (ref 0.0–1.2)
CO2: 21 mmol/L (ref 20–29)
Calcium: 9.8 mg/dL (ref 8.7–10.2)
Chloride: 103 mmol/L (ref 96–106)
Creatinine, Ser: 0.81 mg/dL (ref 0.57–1.00)
Globulin, Total: 2.9 g/dL (ref 1.5–4.5)
Glucose: 82 mg/dL (ref 70–99)
Potassium: 4.3 mmol/L (ref 3.5–5.2)
Sodium: 139 mmol/L (ref 134–144)
Total Protein: 7.5 g/dL (ref 6.0–8.5)
eGFR: 103 mL/min/1.73

## 2024-06-15 LAB — LIPID PANEL
Chol/HDL Ratio: 2.3 ratio (ref 0.0–4.4)
Cholesterol, Total: 166 mg/dL (ref 100–199)
HDL: 72 mg/dL
LDL Chol Calc (NIH): 78 mg/dL (ref 0–99)
Triglycerides: 84 mg/dL (ref 0–149)
VLDL Cholesterol Cal: 16 mg/dL (ref 5–40)

## 2024-06-15 LAB — TSH+FREE T4
Free T4: 1.14 ng/dL (ref 0.82–1.77)
TSH: 1.33 u[IU]/mL (ref 0.450–4.500)

## 2024-06-17 ENCOUNTER — Ambulatory Visit: Payer: Self-pay

## 2024-06-28 ENCOUNTER — Encounter: Payer: Self-pay | Admitting: Women's Health

## 2024-07-08 ENCOUNTER — Ambulatory Visit

## 2024-07-12 ENCOUNTER — Encounter: Payer: Self-pay | Admitting: Women's Health

## 2024-07-15 ENCOUNTER — Other Ambulatory Visit: Payer: Self-pay | Admitting: Women's Health

## 2024-12-14 ENCOUNTER — Ambulatory Visit
